# Patient Record
Sex: Female | Born: 1950 | Race: White | Hispanic: No | Marital: Single | Smoking: Current every day smoker
Health system: Southern US, Community
[De-identification: ages and names within clinical notes are randomized; demographics above are authoritative.]

## PROBLEM LIST (undated history)

## (undated) DIAGNOSIS — G3183 Dementia with Lewy bodies: Secondary | ICD-10-CM

## (undated) DIAGNOSIS — G20A1 Parkinson's disease without dyskinesia, without mention of fluctuations: Secondary | ICD-10-CM

## (undated) DIAGNOSIS — F0280 Dementia in other diseases classified elsewhere without behavioral disturbance: Secondary | ICD-10-CM

## (undated) DIAGNOSIS — G20 Parkinson's disease: Secondary | ICD-10-CM

## (undated) HISTORY — PX: CARPAL TUNNEL RELEASE: SHX101

## (undated) HISTORY — PX: NO PAST SURGERIES: SHX2092

---

## 2009-09-08 ENCOUNTER — Emergency Department: Payer: Self-pay | Admitting: Emergency Medicine

## 2010-01-14 ENCOUNTER — Ambulatory Visit: Payer: Self-pay | Admitting: Internal Medicine

## 2010-01-16 ENCOUNTER — Ambulatory Visit: Payer: Self-pay | Admitting: Internal Medicine

## 2011-08-09 ENCOUNTER — Ambulatory Visit: Payer: Self-pay | Admitting: Family Medicine

## 2014-10-02 ENCOUNTER — Other Ambulatory Visit: Payer: Self-pay | Admitting: Family Medicine

## 2014-10-02 ENCOUNTER — Ambulatory Visit
Admission: RE | Admit: 2014-10-02 | Discharge: 2014-10-02 | Disposition: A | Discharge status: Home / Self Care | Payer: Medicare Other | Source: Ambulatory Visit | Attending: Family Medicine | Admitting: Family Medicine

## 2014-10-02 DIAGNOSIS — G319 Degenerative disease of nervous system, unspecified: Secondary | ICD-10-CM | POA: Insufficient documentation

## 2014-10-02 DIAGNOSIS — S0990XA Unspecified injury of head, initial encounter: Secondary | ICD-10-CM | POA: Diagnosis present

## 2014-10-02 DIAGNOSIS — R51 Headache: Secondary | ICD-10-CM | POA: Diagnosis present

## 2017-02-25 ENCOUNTER — Encounter: Payer: Self-pay | Admitting: Emergency Medicine

## 2017-02-25 ENCOUNTER — Emergency Department: Payer: Medicare Other

## 2017-02-25 ENCOUNTER — Emergency Department
Admission: EM | Admit: 2017-02-25 | Discharge: 2017-02-25 | Disposition: A | Discharge status: Home / Self Care | Payer: Medicare Other | Attending: Emergency Medicine | Admitting: Emergency Medicine

## 2017-02-25 DIAGNOSIS — N3001 Acute cystitis with hematuria: Secondary | ICD-10-CM

## 2017-02-25 DIAGNOSIS — Z79899 Other long term (current) drug therapy: Secondary | ICD-10-CM | POA: Diagnosis not present

## 2017-02-25 DIAGNOSIS — R410 Disorientation, unspecified: Secondary | ICD-10-CM

## 2017-02-25 DIAGNOSIS — F17210 Nicotine dependence, cigarettes, uncomplicated: Secondary | ICD-10-CM | POA: Diagnosis not present

## 2017-02-25 DIAGNOSIS — Z7982 Long term (current) use of aspirin: Secondary | ICD-10-CM | POA: Insufficient documentation

## 2017-02-25 DIAGNOSIS — G20 Parkinson's disease: Secondary | ICD-10-CM | POA: Insufficient documentation

## 2017-02-25 HISTORY — DX: Parkinson's disease without dyskinesia, without mention of fluctuations: G20.A1

## 2017-02-25 HISTORY — DX: Dementia in other diseases classified elsewhere, unspecified severity, without behavioral disturbance, psychotic disturbance, mood disturbance, and anxiety: F02.80

## 2017-02-25 HISTORY — DX: Dementia with Lewy bodies: G31.83

## 2017-02-25 HISTORY — DX: Parkinson's disease: G20

## 2017-02-25 LAB — COMPREHENSIVE METABOLIC PANEL
ALBUMIN: 4.6 g/dL (ref 3.5–5.0)
ALT: 53 U/L (ref 14–54)
AST: 44 U/L — AB (ref 15–41)
Alkaline Phosphatase: 73 U/L (ref 38–126)
Anion gap: 13 (ref 5–15)
BILIRUBIN TOTAL: 0.4 mg/dL (ref 0.3–1.2)
BUN: 17 mg/dL (ref 6–20)
CO2: 23 mmol/L (ref 22–32)
CREATININE: 0.68 mg/dL (ref 0.44–1.00)
Calcium: 9.6 mg/dL (ref 8.9–10.3)
Chloride: 101 mmol/L (ref 101–111)
GFR calc Af Amer: >60 mL/min (ref >60)
GFR calc non Af Amer: >60 mL/min (ref >60)
GLUCOSE: 93 mg/dL (ref 65–99)
POTASSIUM: 3.3 mmol/L — AB (ref 3.5–5.1)
Sodium: 137 mmol/L (ref 135–145)
TOTAL PROTEIN: 8.2 g/dL — AB (ref 6.5–8.1)

## 2017-02-25 LAB — CBC
HEMATOCRIT: 42.5 % (ref 35.0–47.0)
Hemoglobin: 14.4 g/dL (ref 12.0–16.0)
MCH: 31.9 pg (ref 26.0–34.0)
MCHC: 33.9 g/dL (ref 32.0–36.0)
MCV: 94.1 fL (ref 80.0–100.0)
Platelets: 220 10*3/uL (ref 150–440)
RBC: 4.51 MIL/uL (ref 3.80–5.20)
RDW: 14.5 % (ref 11.5–14.5)
WBC: 8.2 10*3/uL (ref 3.6–11.0)

## 2017-02-25 LAB — URINALYSIS, COMPLETE (UACMP) WITH MICROSCOPIC
Bilirubin Urine: NEGATIVE
Glucose, UA: NEGATIVE mg/dL
Ketones, ur: 20 mg/dL — AB
Nitrite: NEGATIVE
Protein, ur: 100 mg/dL — AB
SPECIFIC GRAVITY, URINE: 1.023 (ref 1.005–1.030)
pH: 5 (ref 5.0–8.0)

## 2017-02-25 LAB — TROPONIN I: Troponin I: <0.03 ng/mL (ref <0.03)

## 2017-02-25 MED ORDER — CEPHALEXIN 500 MG PO CAPS: 500.0000 mg | ORAL_CAPSULE | Freq: Three times a day (TID) | ORAL | 0 refills | Status: AC

## 2017-02-25 MED ORDER — CEFTRIAXONE SODIUM IN DEXTROSE 20 MG/ML IV SOLN
1.0000 g | Freq: Once | INTRAVENOUS | Status: AC
  Administered 2017-02-25: 1 g via INTRAVENOUS
  Filled 2017-02-25: qty 50

## 2017-02-25 NOTE — ED Provider Notes (Signed)
[facility] Emergency Department Provider Note ____________________________________________   I have reviewed the triage vital signs and the triage nursing note.  HISTORY  Chief Complaint Altered Mental Status   Historian Patient and friend who is her power of attorney due to patient's underlying liver body dementia/Parkinson's  HPI Maria Wu is a 66 y.o. female with Lewy body dementia and Parkinson's, lives with her friend who is also her power of attorney over the past 2 years.  This patient had been doing okay the past couple of days and developed upper respiratory congestion over the past 2-3 days.  Last night the patient seemed to be a little confused.  She took a shower in the middle of the night which is unusual and then this morning was found in her roommates room, with close off.  Mild cough without significant wheezing.  Last COPD exacerbation with prednisone was over 3 weeks ago.  No no fevers.  No reported vomiting or diarrhea.  No headache or chest pain or abdominal pain.  Friend states this has happened before once when there is urinary tract infection.   Past Medical History:  Diagnosis Date  . Lewy body dementia   . Parkinson's disease (HCC)     There are no active problems to display for this patient.   History reviewed. No pertinent surgical history.  Prior to Admission medications   Medication Sig Start Date End Date Taking? Authorizing Provider  aspirin EC 81 MG tablet Take 81 mg by mouth daily.   Yes [provider]  cilostazol (PLETAL) 50 MG tablet Take 50 mg by mouth 2 (two) times daily.   Yes [provider]  citalopram (CELEXA) 40 MG tablet Take 40 mg by mouth daily.   Yes [provider]  docusate sodium (COLACE) 100 MG capsule Take 100 mg by mouth daily.   Yes [provider]  donepezil (ARICEPT) 10 MG tablet Take 10 mg by mouth daily.   Yes [provider]  ENALAPRIL MALEATE  PO Take 1 tablet by mouth daily.   Yes [provider]  ezetimibe (ZETIA) 10 MG tablet Take 10 mg by mouth daily.   Yes [provider]  Melatonin 3 MG TABS Take 1 tablet by mouth at bedtime.   Yes [provider]  metoprolol tartrate (LOPRESSOR) 25 MG tablet Take 25 mg by mouth daily.   Yes [provider]  Multiple Vitamins-Minerals (IMMUNE SUPPORT PO) Take 1 tablet by mouth.   Yes [provider]  Probiotic Product (PROBIOTIC FORMULA) CAPS Take 1 capsule by mouth daily.   Yes [provider]  valACYclovir (VALTREX) 500 MG tablet Take 500-1,000 mg by mouth daily. 1000mg  for breakouts   Yes [provider]    Allergies  Allergen Reactions  . Coumadin [Warfarin Sodium] Other (See Comments)    unknown  . Statins     cramps    No family history on file.  Social History Social History  Substance Use Topics  . Smoking status: Current Every Day Smoker    Packs/day: 1.00    Types: Cigarettes  . Smokeless tobacco: Not on file  . Alcohol use Not on file    Review of Systems  Constitutional: Negative for fever. Eyes: Negative for visual changes. ENT: Negative for sore throat.  Mild nasal congestion. Cardiovascular: Negative for chest pain. Respiratory: Negative for shortness of breath. Gastrointestinal: Negative for abdominal pain, vomiting and diarrhea. Genitourinary: Negative for dysuria. Musculoskeletal: Negative for back pain.  Skin: Negative for rash. Neurological: Negative for headache.  ____________________________________________   PHYSICAL EXAM:  VITAL SIGNS: ED Triage Vitals  Enc Vitals Group     BP 02/25/17 1318 119/74     Pulse Rate 02/25/17 1318 95     Resp 02/25/17 1318 20     Temp 02/25/17 1318 99.2 F (37.3 C)     Temp Source 02/25/17 1318 Oral     SpO2 02/25/17 1318 99 %     Weight 02/25/17 1319 136 lb (61.7 kg)     Height 02/25/17 1319 5\' 4"  (1.626 m)     Head Circumference --      Peak  Flow --      Pain Score --      Pain Loc --      Pain Edu? --      Excl. in GC? --      Constitutional: Alert and operative. Well appearing and in no distress. HEENT   Head: Normocephalic and atraumatic.      Eyes: Conjunctivae are normal. Pupils equal and round.       Ears:         Nose: No congestion without any obvious rhinorrhea.   Mouth/Throat: Mucous membranes are moist.   Neck: No stridor. Cardiovascular/Chest: Normal rate, regular rhythm.  No murmurs, rubs, or gallops. Respiratory: Normal respiratory effort without tachypnea nor retractions. Breath sounds are clear and equal bilaterally. No wheezes/rales/rhonchi. Gastrointestinal: Soft. No distention, no guarding, no rebound. Nontender.    Genitourinary/rectal:Deferred Musculoskeletal: Nontender with normal range of motion in all extremities. No joint effusions.  No lower extremity tenderness.  No edema. Neurologic: No facial droop.  Poor historian.  Normal speech and language. No gross or focal neurologic deficits are appreciated. Skin:  Skin is warm, dry and intact. No rash noted. Psychiatric: Mood and affect are normal. Speech and behavior are normal. Patient exhibits appropriate insight and judgment.   ____________________________________________  LABS (pertinent positives/negatives) I, Governor Rooks, MD the attending physician have reviewed the labs noted below.  Labs Reviewed  COMPREHENSIVE METABOLIC PANEL - Abnormal; Notable for the following:       Result Value   Potassium 3.3 (*)    Total Protein 8.2 (*)    AST 44 (*)    All other components within normal limits  CBC  TROPONIN I  BLOOD GAS, VENOUS  URINALYSIS, COMPLETE (UACMP) WITH MICROSCOPIC    ____________________________________________    EKG I, Governor Rooks, MD, the attending physician have personally viewed and interpreted all ECGs.  91 bpm.  Normal sinus rhythm.  Narrow QRS.  Normal axis.  Normal ST and T  wave ____________________________________________  RADIOLOGY All Xrays were viewed by me.  Imaging interpreted by Radiologist, and I, Governor Rooks, MD the attending physician have reviewed the radiologist interpretation noted below.  X-ray two-view: Pending __________________________________________  PROCEDURES  Procedure(s) performed: None  Critical Care performed: None  ____________________________________________  No current facility-administered medications on file prior to encounter.    No current outpatient prescriptions on file prior to encounter.    ____________________________________________  ED COURSE / ASSESSMENT AND PLAN  Pertinent labs & imaging results that were available during my care of the patient were reviewed by me and considered in my medical decision making (see chart for details).    Caregiver noticed a change in patient having some confusion over the last 24 hours.  She does have mild upper respiratory congestion without significant lower respiratory symptoms, but will obtain an x-ray.  Patient  has no focal neurologic deficits nor complaints, this does not seem like a focal intracranial issue like CVA or bleed or tumor etc.  Laboratory studies are overall reassuring without any GI disturbance or dehydration.  I am going to add on blood gas to evaluate for any CO2 retention given that she does have a history of COPD, although she does not appear to be having an acute COPD exacerbation now.  Urinalysis is pending.  Patient is agreeable for and out catheter sample.  Patient care transferred to Dr. Roxan Hockey at shift change 3:20 PM.  Patient is awaiting blood gas, chest x-ray, and urinalysis.  Friend and POA here is agreeable that if these are reassuring, this may be due to underlying dementia and they are comfortable taking her home.  DIFFERENTIAL DIAGNOSIS: Includes but not limited to disorders of glucose, electrolyte disturbance, dehydration,  infection, urinary tract infection, CO2 retention, liver disease, dementia, stroke, etc.  CONSULTATIONS: None   Patient / Family / Caregiver informed of clinical course, medical decision-making process, and agree with plan.   ___________________________________________   FINAL CLINICAL IMPRESSION(S) / ED DIAGNOSES   Final diagnoses:  Confusion              Note: This dictation was prepared with Dragon dictation. Any transcriptional errors that result from this process are unintentional    Governor Rooks, MD 02/25/17 1520

## 2017-02-25 NOTE — ED Provider Notes (Signed)
Patient received in sign-out from Dr. Shaune PollackLord.  Workup and evaluation pending UA.  Patient's urinalysis does show evidence of acute urinary tract infection with hematuria.  Given her age and risk factors with confusion we will give her a dose of IV Rocephin.  Patient without any other signs or symptoms of urosepsis.  Patient and family feel comfortable with discharge home and follow-up as an outpatient.  Will be discharged with oral Keflex.      Willy Eddyobinson, Jayvyn Haselton, MD 02/25/17 [phone]

## 2017-02-25 NOTE — ED Triage Notes (Signed)
Friend and caregiver states Maria Wu has been increased confusion x 2 days. History of Lewy body dementia with some baseline confusion. Friend with her is also healthcare power of attorney.

## 2017-02-26 LAB — URINE CULTURE: Culture: NO GROWTH

## 2017-03-03 LAB — BLOOD GAS, VENOUS
Acid-Base Excess: 2.4 mmol/L — ABNORMAL HIGH (ref 0.0–2.0)
Bicarbonate: 27.2 mmol/L (ref 20.0–28.0)
PATIENT TEMPERATURE: 37
PH VEN: 7.42 (ref 7.250–7.430)
pCO2, Ven: 42 mmHg — ABNORMAL LOW (ref 44.0–60.0)

## 2018-09-23 ENCOUNTER — Ambulatory Visit
Admission: EM | Admit: 2018-09-23 | Discharge: 2018-09-23 | Disposition: A | Discharge status: Home / Self Care | Payer: Medicare Other | Attending: Urgent Care | Admitting: Urgent Care

## 2018-09-23 ENCOUNTER — Other Ambulatory Visit: Payer: Self-pay

## 2018-09-23 DIAGNOSIS — N3001 Acute cystitis with hematuria: Secondary | ICD-10-CM | POA: Insufficient documentation

## 2018-09-23 LAB — URINALYSIS, COMPLETE (UACMP) WITH MICROSCOPIC
Bilirubin Urine: NEGATIVE
Glucose, UA: NEGATIVE mg/dL
Ketones, ur: NEGATIVE mg/dL
Nitrite: POSITIVE — AB
Protein, ur: 100 mg/dL — AB
Specific Gravity, Urine: 1.025 (ref 1.005–1.030)
WBC, UA: >50 WBC/hpf (ref 0–5)
pH: 5.5 (ref 5.0–8.0)

## 2018-09-23 MED ORDER — PHENAZOPYRIDINE HCL 100 MG PO TABS: 200.0000 mg | ORAL_TABLET | Freq: Three times a day (TID) | ORAL | 0 refills | Status: AC

## 2018-09-23 MED ORDER — NITROFURANTOIN MONOHYD MACRO 100 MG PO CAPS: 100.0000 mg | ORAL_CAPSULE | Freq: Two times a day (BID) | ORAL | 0 refills | Status: AC

## 2018-09-23 NOTE — ED Provider Notes (Signed)
[address], [address] [place], [place] [address] [phone]   Name: Maria Wu DOB: 06/03/1950 MRN: [id number] CSN: [id number] PCP: Virgina EvenerPowell-Tillman, Levonne Genese, MD  Arrival date and time:  09/23/18 1233  Chief Complaint:  Urinary Frequency  NOTE: Prior to seeing the patient today, I have reviewed the triage nursing documentation and vital signs. Clinical staff has updated patient's PMH/PSHx, current medication list, and drug allergies/intolerances to ensure comprehensive history available to assist in medical decision making.   History:   HPI: Maria Wu is a 68 y.o. female who presents today with complaints of urinary symptoms worsening x1 week.  Patient presents with a caregiver today, who secondary to patient's advanced Lewy body dementia, serves as the primary historian.  Caregiver notes that patient has been incontinent of urine for the last week.  Patient with urinary frequency and urgency.  Unsure if she is experiencing dysuria.  Patient is only voiding very small amounts each time.  Caregiver has not appreciated any gross hematuria.  Patient reported to remove her clothes and urinate in the floor and on the couch, which is not normal behavior for the patient.  Caregiver indicates the patient has complained of pain in her lower back.  No measured fevers at home. Patient has a history of some issues with fecal incontinence, therefore they are using incontinence garments already.  Patient has been confused beyond her demented baseline.  Past Medical History:  Diagnosis Date  . Lewy body dementia (HCC)   . Parkinson's disease (HCC)     History reviewed. No pertinent surgical history.  History reviewed. No pertinent family history.  Social History   Socioeconomic History  . Marital status: Single    Spouse name: Not on file  . Number of children: Not on file  . Years of education: Not on file  . Highest education level: Not on file  Occupational History  .  Not on file  Social Needs  . Financial resource strain: Not on file  . Food insecurity:    Worry: Not on file    Inability: Not on file  . Transportation needs:    Medical: Not on file    Non-medical: Not on file  Tobacco Use  . Smoking status: Current Every Day Smoker    Packs/day: 0.50    Types: Cigarettes  . Smokeless tobacco: Never Used  Substance and Sexual Activity  . Alcohol use: Not Currently  . Drug use: Not Currently  . Sexual activity: Not on file  Lifestyle  . Physical activity:    Days per week: Not on file    Minutes per session: Not on file  . Stress: Not on file  Relationships  . Social connections:    Talks on phone: Not on file    Gets together: Not on file    Attends religious service: Not on file    Active member of club or organization: Not on file    Attends meetings of clubs or organizations: Not on file    Relationship status: Not on file  . Intimate partner violence:    Fear of current or ex partner: Not on file    Emotionally abused: Not on file    Physically abused: Not on file    Forced sexual activity: Not on file  Other Topics Concern  . Not on file  Social History Narrative  . Not on file    There are no active problems to display for this patient.   Home Medications:  Current Meds  Medication Sig  . aspirin EC 81 MG tablet Take 81 mg by mouth daily.  . cilostazol (PLETAL) 50 MG tablet Take 50 mg by mouth 2 (two) times daily.  . citalopram (CELEXA) 40 MG tablet Take 40 mg by mouth daily.  Marland Kitchen donepezil (ARICEPT) 10 MG tablet Take 10 mg by mouth daily.  . ENALAPRIL MALEATE PO Take 1 tablet by mouth daily.  Marland Kitchen ezetimibe (ZETIA) 10 MG tablet Take 10 mg by mouth daily.  . Melatonin 3 MG TABS Take 1 tablet by mouth at bedtime.  . metoprolol tartrate (LOPRESSOR) 25 MG tablet Take 25 mg by mouth daily.  . Multiple Vitamins-Minerals (IMMUNE SUPPORT PO) Take 1 tablet by mouth.  . nitroGLYCERIN (NITROSTAT) 0.4 MG SL tablet Place under the  tongue.  . Probiotic Product (PROBIOTIC FORMULA) CAPS Take 1 capsule by mouth daily.  . valACYclovir (VALTREX) 500 MG tablet Take 500-1,000 mg by mouth daily. 1000mg  for breakouts    Allergies:   Sulfa antibiotics; Clopidogrel; Coumadin [warfarin sodium]; and Statins  Review of Systems (ROS): Review of Systems  Constitutional: Negative for chills and fever.  Respiratory: Negative for cough and shortness of breath.   Cardiovascular: Negative for chest pain and palpitations.  Gastrointestinal: Negative for abdominal pain, diarrhea, nausea and vomiting.  Genitourinary: Positive for decreased urine volume, frequency and urgency. Negative for hematuria.       New urinary incontinence  Musculoskeletal: Positive for back pain.  Neurological: Negative for dizziness, syncope and weakness.  Psychiatric/Behavioral: Positive for confusion (increased; baseline dementia).     Physical Exam:  Triage Vital Signs ED Triage Vitals  Enc Vitals Group     BP 09/23/18 1250 107/75     Pulse Rate 09/23/18 1250 (!) 58     Resp 09/23/18 1250 16     Temp 09/23/18 1250 98.5 F (36.9 C)     Temp Source 09/23/18 1250 Oral     SpO2 09/23/18 1250 100 %     Weight 09/23/18 1247 130 lb (59 kg)     Height --      Head Circumference --      Peak Flow --      Pain Score --      Pain Loc --      Pain Edu? --      Excl. in GC? --     Physical Exam  Constitutional: She is well-developed, well-nourished, and in no distress.  HENT:  Head: Normocephalic and atraumatic.  Mouth/Throat: Oropharynx is clear and moist and mucous membranes are normal.  Eyes: Pupils are equal, round, and reactive to light. EOM are normal.  Neck: Normal range of motion. Neck supple. No tracheal deviation present.  Cardiovascular: Regular rhythm, normal heart sounds and intact distal pulses. Bradycardia present. Exam reveals no gallop and no friction rub.  No murmur heard. Pulmonary/Chest: Effort normal and breath sounds normal. No  respiratory distress. She has no wheezes. She has no rales.  Abdominal: Bowel sounds are normal. There is abdominal tenderness in the suprapubic area. There is CVA tenderness.  Lymphadenopathy:    She has no cervical adenopathy.  Neurological: She is alert. She has normal motor skills. She is disoriented (PMH (+) for Lewy body dementia).  Skin: Skin is warm and dry. No rash noted. No erythema.  Psychiatric: Mood, affect and judgment normal.  Nursing note and vitals reviewed.    Urgent Care Treatments / Results:   LABS: PLEASE NOTE: all labs that were ordered this encounter  are listed, however only abnormal results are displayed. Labs Reviewed  URINALYSIS, COMPLETE (UACMP) WITH MICROSCOPIC - Abnormal; Notable for the following components:      Result Value   APPearance CLOUDY (*)    Hgb urine dipstick MODERATE (*)    Protein, ur 100 (*)    Nitrite POSITIVE (*)    Leukocytes,Ua LARGE (*)    Bacteria, UA MANY (*)    All other components within normal limits  URINE CULTURE    EKG: -None  RADIOLOGY: No results found.  PRODEDURES: Procedures  MEDICATIONS RECEIVED THIS VISIT: Medications - No data to display  PERTINENT CLINICAL COURSE NOTES/UPDATES: No data to display   Initial Impression / Assessment and Plan / Urgent Care Course:    JOEANN STEPPE is a 68 y.o. female who presents to [facility] today with complaints of Urinary Frequency  Pertinent labs & imaging results that were available during my care of the patient were personally reviewed by me and considered in my medical decision making (see lab/imaging section of note for values and interpretations).  Patient presents with urinary symptoms x1 week.  Caregiver serving as primary historians patient has advanced Lewy body dementia.  Exam reveals suprapubic tenderness.  UA positive for infection; reflex culture sent.  Patient with allergies to sulfa antibiotics.  Will treat with a seven-day course of  nitrofurantoin.  Discussed that if culture demonstrates resistance to the prescribed antibiotic, we will call caregiver to arrange for change in antibiotic therapy.  We will also provide a short course of Pyridium for as needed use a caregiver for perceived urinary pain.  Patient may use ibuprofen and/or Tylenol as needed as well.  Encouraged to increase fluid intake to flush urinary tract.  Discussed avoiding caffeinated beverages to prevent painful urinary spasms.  Copy of UA provided to caregiver per request.  Discussed follow up with primary care physician in 1 week for re-evaluation. I have reviewed the follow up and strict return precautions for any new or worsening symptoms. Patient is aware of symptoms that would be deemed urgent/emergent, and would thus require further evaluation either here or in the emergency department. At the time of discharge, she verbalized understanding and consent with the discharge plan as it was reviewed with her. All questions were fielded by provider and/or clinic staff prior to patient discharge.    Final Clinical Impressions(s) / Urgent Care Diagnoses:   Final diagnoses:  Acute cystitis with hematuria    New Prescriptions:   Meds ordered this encounter  Medications  . nitrofurantoin, macrocrystal-monohydrate, (MACROBID) 100 MG capsule    Sig: Take 1 capsule (100 mg total) by mouth 2 (two) times daily for 7 days.    Dispense:  14 capsule    Refill:  0  . phenazopyridine (PYRIDIUM) 100 MG tablet    Sig: Take 2 tablets (200 mg total) by mouth 3 (three) times daily.    Dispense:  12 tablet    Refill:  0    Controlled Substance Prescriptions:  [place] Controlled Substance Registry consulted? Not Applicable  NOTE: This note was prepared using Dragon dictation software along with smaller phrase technology. Despite my best ability to proofread, there is the potential that transcriptional errors may still occur from this process, and are completely unintentional.      Verlee Monte, NP 09/23/18 1515

## 2018-09-23 NOTE — ED Triage Notes (Signed)
Patient presents to [facility] with caregiver and caregiver states that she has been urinating on the floor and has had a smell to urine over the last week. States that patient has dementia and she thinks that this may be a UTI. Patient caregiver states that she has been having back pain and has been wearing pullups.

## 2018-09-23 NOTE — Discharge Instructions (Addendum)
It was very nice meeting you today in clinic. Thank you for entrusting me with your care.   As discussed, your urine is POSITIVE for infection. Will approach treatment as follows:  Prescription has been sent to your pharmacy for antibiotics.  Please pick up and take as directed. FINISH the entire course of medication even if you are feeling better.  A culture will be sent on your provided sample. If it comes back resistant to what I have prescribed you, someone will call you and let you know that we will need to change antibiotics. Increase fluid intake as much as possible to flush your urinary tract.  Water is always the best.  Avoid caffeine until your infection clears up, as it can contribute to painful bladder spasms.  May use Tylenol and/or Ibuprofen as needed for pain/fever. Pyridium as needed to help with pain/discomfort.   Make arrangements to follow up with your regular doctor in 1 week for re-evaluation. If your symptoms/condition worsens, please seek follow up care either here or in the ER. Please remember, our [facility] providers are "right here with you" when you need [place].   Again, it was my pleasure to take care of you today. Thank you for choosing our clinic. I hope that you start to feel better quickly.   Quentin Mulling, MSN, APRN, FNP-C, CEN Advanced Practice Provider [facility] [place]

## 2018-09-25 LAB — URINE CULTURE: Culture: >=100000 — AB

## 2018-09-27 ENCOUNTER — Telehealth (HOSPITAL_COMMUNITY): Payer: Self-pay | Admitting: Emergency Medicine

## 2018-09-27 NOTE — Telephone Encounter (Signed)
Urine culture was positive for e coli and was given  macrobid at urgent care visit. Attempted to reach patient. No answer at this time.   

## 2018-10-25 ENCOUNTER — Ambulatory Visit
Admission: EM | Admit: 2018-10-25 | Discharge: 2018-10-25 | Disposition: A | Discharge status: Home / Self Care | Payer: Medicare Other | Attending: Emergency Medicine | Admitting: Emergency Medicine

## 2018-10-25 ENCOUNTER — Encounter: Payer: Self-pay | Admitting: Emergency Medicine

## 2018-10-25 ENCOUNTER — Other Ambulatory Visit: Payer: Self-pay

## 2018-10-25 DIAGNOSIS — R319 Hematuria, unspecified: Secondary | ICD-10-CM | POA: Insufficient documentation

## 2018-10-25 DIAGNOSIS — N390 Urinary tract infection, site not specified: Secondary | ICD-10-CM | POA: Diagnosis not present

## 2018-10-25 LAB — URINALYSIS, COMPLETE (UACMP) WITH MICROSCOPIC
Bilirubin Urine: NEGATIVE
Glucose, UA: NEGATIVE mg/dL
Ketones, ur: NEGATIVE mg/dL
Nitrite: POSITIVE — AB
Protein, ur: NEGATIVE mg/dL
Specific Gravity, Urine: 1.025 (ref 1.005–1.030)
WBC, UA: >50 WBC/hpf (ref 0–5)
pH: 5.5 (ref 5.0–8.0)

## 2018-10-25 MED ORDER — CEPHALEXIN 500 MG PO CAPS
500.0000 mg | ORAL_CAPSULE | Freq: Two times a day (BID) | ORAL | 0 refills | Status: AC
Start: 2018-10-25 — End: 2018-11-01

## 2018-10-25 NOTE — ED Provider Notes (Signed)
[facility] ____________________________________________  Time seen: Approximately 4:33 PM  I have reviewed the triage vital signs and the nursing notes.   HISTORY  Chief Complaint Dysuria  Historian: Care giver  HPI Maria Wu is a 68 y.o. female with history of Parkinson's disease and Lewy body dementia presenting with caregiver for evaluation of urinary frequency, urinary burning sensation.  Caregiver reports she has had some urinary incontinence in her pull-up.  Reports similar issues in May with a urinary tract infection.  States did improve after last Macrobid treatment.  Continues to eat and drink at baseline, but does not drink fluids as well as she should.  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  Caregiver reports no other complaints of pain.  Denies fevers, cough, congestion or recent sickness otherwise.  Denies aggravating alleviating factors.   Past Medical History:  Diagnosis Date  . Lewy body dementia (HCC)   . Parkinson's disease (HCC)     There are no active problems to display for this patient.   History reviewed. No pertinent surgical history.   No current facility-administered medications for this encounter.   Current Outpatient Medications:  .  aspirin EC 81 MG tablet, Take 81 mg by mouth daily., Disp: , Rfl:  .  cilostazol (PLETAL) 50 MG tablet, Take 50 mg by mouth 2 (two) times daily., Disp: , Rfl:  .  citalopram (CELEXA) 40 MG tablet, Take 40 mg by mouth daily., Disp: , Rfl:  .  donepezil (ARICEPT) 10 MG tablet, Take 10 mg by mouth daily., Disp: , Rfl:  .  ENALAPRIL MALEATE PO, Take 1 tablet by mouth daily., Disp: , Rfl:  .  ezetimibe (ZETIA) 10 MG tablet, Take 10 mg by mouth daily., Disp: , Rfl:  .  Melatonin 3 MG TABS, Take 1 tablet by mouth at bedtime., Disp: , Rfl:  .  metoprolol tartrate (LOPRESSOR) 25 MG tablet, Take 25 mg by mouth daily., Disp: , Rfl:  .  Multiple Vitamins-Minerals (IMMUNE SUPPORT PO), Take 1 tablet by  mouth., Disp: , Rfl:  .  nitroGLYCERIN (NITROSTAT) 0.4 MG SL tablet, Place under the tongue., Disp: , Rfl:  .  phenazopyridine (PYRIDIUM) 100 MG tablet, Take 2 tablets (200 mg total) by mouth 3 (three) times daily., Disp: 12 tablet, Rfl: 0 .  Probiotic Product (PROBIOTIC FORMULA) CAPS, Take 1 capsule by mouth daily., Disp: , Rfl:  .  cephALEXin (KEFLEX) 500 MG capsule, Take 1 capsule (500 mg total) by mouth 2 (two) times daily for 7 days., Disp: 14 capsule, Rfl: 0 .  valACYclovir (VALTREX) 500 MG tablet, Take 500-1,000 mg by mouth daily. 1000mg  for breakouts, Disp: , Rfl:   Allergies Sulfa antibiotics, Clopidogrel, Coumadin [warfarin sodium], and Statins  History reviewed. No pertinent family history.  Social History Social History   Tobacco Use  . Smoking status: Current Every Day Smoker    Packs/day: 0.50    Types: Cigarettes  . Smokeless tobacco: Never Used  Substance Use Topics  . Alcohol use: Not Currently  . Drug use: Not Currently    Review of Systems Constitutional: No fever Cardiovascular: Denies chest pain. Respiratory: Denies shortness of breath. Gastrointestinal: No abdominal pain.  No nausea, no vomiting.  No diarrhea.   Genitourinary: Positive for dysuria. Musculoskeletal: Positive chronic back pain. Skin: Negative for rash.   ____________________________________________   PHYSICAL EXAM:  VITAL SIGNS: ED Triage Vitals  Enc Vitals Group     BP 10/25/18 1452 92/68     Pulse  Rate 10/25/18 1452 61     Resp 10/25/18 1452 14     Temp 10/25/18 1452 98.1 F (36.7 C)     Temp Source 10/25/18 1452 Oral     SpO2 10/25/18 1452 100 %     Weight 10/25/18 1458 127 lb 11.2 oz (57.9 kg)     Height --      Head Circumference --      Peak Flow --      Pain Score 10/25/18 1449 2     Pain Loc --      Pain Edu? --      Excl. in [place]? --     Constitutional: Alert. Well appearing and in no acute distress. ENT      Head: Normocephalic and atraumatic. Cardiovascular:  Normal rate, regular rhythm. Grossly normal heart sounds.  Good peripheral circulation. Respiratory: Normal respiratory effort without tachypnea nor retractions. Breath sounds are clear and equal bilaterally. No wheezes, rales, rhonchi. Gastrointestinal: Mild midline suprapubic tenderness palpation.  Abdomen otherwise soft and nontender.  No CVA tenderness. Musculoskeletal: Steady gait. Neurologic: Steady gait.  Dementia. Skin:  Skin is warm, dry and intact. No rash noted. Psychiatric: Mood and affect are normal. Speech and behavior are normal. Patient exhibits appropriate insight and judgment   ___________________________________________   LABS (all labs ordered are listed, but only abnormal results are displayed)  Labs Reviewed  URINALYSIS, COMPLETE (UACMP) WITH MICROSCOPIC - Abnormal; Notable for the following components:      Result Value   APPearance CLOUDY (*)    Hgb urine dipstick SMALL (*)    Nitrite POSITIVE (*)    Leukocytes,Ua MODERATE (*)    Bacteria, UA MANY (*)    All other components within normal limits  URINE CULTURE   ____________________________________________   PROCEDURES Procedures    INITIAL IMPRESSION / ASSESSMENT AND PLAN / ED COURSE  Pertinent labs & imaging results that were available during my care of the patient were reviewed by me and considered in my medical decision making (see chart for details).  Well-appearing patient.  No acute distress.  Caregiver at bedside.  Dysuria complaints.  Urinalysis reviewed, consistent with UTI.  We will culture.  Will treat with oral Keflex.  Encourage supportive care, close monitoring.Discussed indication, risks and benefits of medications with patient and caregiver.  Discussed follow up with Primary care physician this week. Discussed follow up and return parameters including no resolution or any worsening concerns. Caregiver verbalized understanding and agreed to plan.    ____________________________________________   FINAL CLINICAL IMPRESSION(S) / ED DIAGNOSES  Final diagnoses:  Urinary tract infection with hematuria, site unspecified     ED Discharge Orders         Ordered    cephALEXin (KEFLEX) 500 MG capsule  2 times daily     10/25/18 1556           Note: This dictation was prepared with Dragon dictation along with smaller phrase technology. Any transcriptional errors that result from this process are unintentional.         Marylene Land, NP 10/25/18 1640

## 2018-10-25 NOTE — ED Triage Notes (Signed)
Patient c/o burning when urinating and increase in urinary frequency for a week.  Caregiver denies fevers.

## 2018-10-25 NOTE — Discharge Instructions (Addendum)
Take medication as prescribed. Drink plenty of fluids. Monitor.  ° °Follow up with your primary care physician this week as needed. Return to Urgent care for new or worsening concerns.  ° °

## 2018-10-27 ENCOUNTER — Telehealth (HOSPITAL_COMMUNITY): Payer: Self-pay | Admitting: Emergency Medicine

## 2018-10-27 LAB — URINE CULTURE: Culture: >=100000 — AB

## 2018-10-27 NOTE — Telephone Encounter (Signed)
Urine culture was positive for e coli and was given keflex  at urgent care visit. Attempted to reach patient. No answer at this time.   

## 2018-11-24 ENCOUNTER — Ambulatory Visit
Admission: EM | Admit: 2018-11-24 | Discharge: 2018-11-24 | Disposition: A | Discharge status: Home / Self Care | Payer: Medicare Other | Attending: Family Medicine | Admitting: Family Medicine

## 2018-11-24 ENCOUNTER — Other Ambulatory Visit: Payer: Self-pay

## 2018-11-24 ENCOUNTER — Encounter: Payer: Self-pay | Admitting: Emergency Medicine

## 2018-11-24 DIAGNOSIS — N390 Urinary tract infection, site not specified: Secondary | ICD-10-CM | POA: Diagnosis present

## 2018-11-24 DIAGNOSIS — R319 Hematuria, unspecified: Secondary | ICD-10-CM | POA: Diagnosis present

## 2018-11-24 LAB — URINALYSIS, COMPLETE (UACMP) WITH MICROSCOPIC
Bilirubin Urine: NEGATIVE
Glucose, UA: NEGATIVE mg/dL
Ketones, ur: NEGATIVE mg/dL
Nitrite: POSITIVE — AB
Protein, ur: NEGATIVE mg/dL
Specific Gravity, Urine: 1.01 (ref 1.005–1.030)
WBC, UA: >50 WBC/hpf (ref 0–5)
pH: 5.5 (ref 5.0–8.0)

## 2018-11-24 MED ORDER — CIPROFLOXACIN HCL 500 MG PO TABS: 500.0000 mg | ORAL_TABLET | Freq: Two times a day (BID) | ORAL | 0 refills | Status: AC

## 2018-11-24 NOTE — Discharge Instructions (Addendum)
Take medication as prescribed. Monitor. Drink plenty of fluids.  ° °Follow up with your primary care physician this week as needed. Return to Urgent care for new or worsening concerns.  ° °

## 2018-11-24 NOTE — ED Provider Notes (Signed)
[facility] ____________________________________________  Time seen: Approximately 1:22 PM  I have reviewed the triage vital signs and the nursing notes.   HISTORY  Chief Complaint No chief complaint on file.  Historian: Caregiver  HPI Maria Wu is a 68 y.o. female past medical history of Parkinson's and Lewy body dementia presenting with caregiver at bedside for evaluation of urinary tract infection symptoms.  Caregiver reports that patient for the last 3 days has had some intermittent discomfort with urination as well as some cloudy urine.  Did give Pyridium which helped complaints.  States symptoms consistent with previous and recent UTIs.  Has had 2 recent UTIs treated with Macrobid and cephalexin.  Continues to eat and drink well.  States trigger is that patient does not always wipe correctly.  No fevers.  No recent cough or otherwise sickness.   Past Medical History:  Diagnosis Date  . Lewy body dementia ([place])   . Parkinson's disease ([place])     There are no active problems to display for this patient.   Past Surgical History:  Procedure Laterality Date  . CARPAL TUNNEL RELEASE    . NO PAST SURGERIES       No current facility-administered medications for this encounter.   Current Outpatient Medications:  .  aspirin EC 81 MG tablet, Take 81 mg by mouth daily., Disp: , Rfl:  .  cilostazol (PLETAL) 50 MG tablet, Take 50 mg by mouth 2 (two) times daily., Disp: , Rfl:  .  citalopram (CELEXA) 40 MG tablet, Take 40 mg by mouth daily., Disp: , Rfl:  .  donepezil (ARICEPT) 10 MG tablet, Take 10 mg by mouth daily., Disp: , Rfl:  .  ENALAPRIL MALEATE PO, Take 1 tablet by mouth daily., Disp: , Rfl:  .  ezetimibe (ZETIA) 10 MG tablet, Take 10 mg by mouth daily., Disp: , Rfl:  .  Melatonin 3 MG TABS, Take 1 tablet by mouth at bedtime., Disp: , Rfl:  .  metoprolol tartrate (LOPRESSOR) 25 MG tablet, Take 25 mg by mouth daily., Disp: , Rfl:  .  Multiple  Vitamins-Minerals (IMMUNE SUPPORT PO), Take 1 tablet by mouth., Disp: , Rfl:  .  nitroGLYCERIN (NITROSTAT) 0.4 MG SL tablet, Place under the tongue., Disp: , Rfl:  .  phenazopyridine (PYRIDIUM) 100 MG tablet, Take 2 tablets (200 mg total) by mouth 3 (three) times daily., Disp: 12 tablet, Rfl: 0 .  Probiotic Product (PROBIOTIC FORMULA) CAPS, Take 1 capsule by mouth daily., Disp: , Rfl:  .  valACYclovir (VALTREX) 500 MG tablet, Take 500-1,000 mg by mouth daily. 1000mg  for breakouts, Disp: , Rfl:  .  ciprofloxacin (CIPRO) 500 MG tablet, Take 1 tablet (500 mg total) by mouth every 12 (twelve) hours for 7 days., Disp: 14 tablet, Rfl: 0  Allergies Sulfa antibiotics, Clopidogrel, Coumadin [warfarin sodium], and Statins  History reviewed. No pertinent family history.  Social History Social History   Tobacco Use  . Smoking status: Current Every Day Smoker    Packs/day: 0.50    Types: Cigarettes  . Smokeless tobacco: Never Used  Substance Use Topics  . Alcohol use: Not Currently  . Drug use: Not Currently    Review of Systems Constitutional: No fever Cardiovascular: Denies chest pain. Respiratory: Denies shortness of breath. Gastrointestinal: No abdominal pain. Genitourinary: Positive for dysuria. Musculoskeletal: Negative for back pain. Skin: Negative for rash.  ____________________________________________   PHYSICAL EXAM:  VITAL SIGNS: ED Triage Vitals  Enc Vitals Group     BP  11/24/18 1243 140/87     Pulse Rate 11/24/18 1243 (!) 55     Resp 11/24/18 1243 18     Temp 11/24/18 1243 97.6 F (36.4 C)     Temp Source 11/24/18 1243 Oral     SpO2 11/24/18 1243 100 %     Weight 11/24/18 1244 130 lb (59 kg)     Height 11/24/18 1244 5\' 4"  (1.626 m)     Head Circumference --      Peak Flow --      Pain Score --      Pain Loc --      Pain Edu? --      Excl. in [place]? --     Constitutional: Alert. Well appearing and in no acute distress. Eyes: Conjunctivae are normal.  ENT       Head: Normocephalic and atraumatic. Cardiovascular: Normal rate, regular rhythm. Grossly normal heart sounds.  Good peripheral circulation. Respiratory: Normal respiratory effort without tachypnea nor retractions. Breath sounds are clear and equal bilaterally. No wheezes, rales, rhonchi. Gastrointestinal: Soft and nontender. No CVA tenderness. Musculoskeletal: Steady gait. Neurologic: Alert and interactive. Skin:  Skin is warm, dry and intact.    ___________________________________________   LABS (all labs ordered are listed, but only abnormal results are displayed)  Labs Reviewed  URINALYSIS, COMPLETE (UACMP) WITH MICROSCOPIC - Abnormal; Notable for the following components:      Result Value   Color, Urine AMBER (*)    APPearance CLOUDY (*)    Hgb urine dipstick TRACE (*)    Nitrite POSITIVE (*)    Leukocytes,Ua MODERATE (*)    Bacteria, UA MANY (*)    All other components within normal limits  URINE CULTURE   ____________________________________________  PROCEDURES Procedures    INITIAL IMPRESSION / ASSESSMENT AND PLAN / ED COURSE  Pertinent labs & imaging results that were available during my care of the patient were reviewed by me and considered in my medical decision making (see chart for details).  Well-appearing patient.  Caregiver at bedside.  Urinalysis positive for UTI.  We will culture.  Patient recent urine culture showed susceptibility all antibiotics.  Recently treated with Macrobid and Keflex and patient sulfa allergic.  Denies renal insufficiency.  Will treat with oral Cipro.  Follow-up with primary care.Discussed indication, risks and benefits of medications with caregiver.  Discussed follow up and return parameters including no resolution or any worsening concerns. Caregiver verbalized understanding and agreed to plan.   ____________________________________________   FINAL CLINICAL IMPRESSION(S) / ED DIAGNOSES  Final diagnoses:  Urinary tract infection  with hematuria, site unspecified     ED Discharge Orders         Ordered    ciprofloxacin (CIPRO) 500 MG tablet  Every 12 hours     11/24/18 1322           Note: This dictation was prepared with Dragon dictation along with smaller phrase technology. Any transcriptional errors that result from this process are unintentional.         Renford DillsMiller, Elijahjames Fuelling, NP 11/24/18 1355

## 2018-11-24 NOTE — ED Triage Notes (Signed)
Pt c/o urinary frequency, and  urinary retention. Denies fever. Started about 3 days ago. Pt has dementia

## 2018-11-26 ENCOUNTER — Telehealth (HOSPITAL_COMMUNITY): Payer: Self-pay | Admitting: Emergency Medicine

## 2018-11-26 NOTE — Telephone Encounter (Signed)
Urine culture was positive for e coli and was given  cipro at urgent care visit. Attempted to reach patient. No answer at this time.   

## 2018-11-27 LAB — URINE CULTURE: Culture: >=100000 — AB

## 2019-01-26 ENCOUNTER — Ambulatory Visit
Admission: EM | Admit: 2019-01-26 | Discharge: 2019-01-26 | Disposition: A | Discharge status: Home / Self Care | Payer: Medicare Other | Attending: Urgent Care | Admitting: Urgent Care

## 2019-01-26 ENCOUNTER — Other Ambulatory Visit: Payer: Self-pay

## 2019-01-26 DIAGNOSIS — R4182 Altered mental status, unspecified: Secondary | ICD-10-CM | POA: Insufficient documentation

## 2019-01-26 DIAGNOSIS — R32 Unspecified urinary incontinence: Secondary | ICD-10-CM

## 2019-01-26 LAB — URINALYSIS, COMPLETE (UACMP) WITH MICROSCOPIC
Bacteria, UA: NONE SEEN
Bilirubin Urine: NEGATIVE
Glucose, UA: NEGATIVE mg/dL
Hgb urine dipstick: NEGATIVE
Ketones, ur: NEGATIVE mg/dL
Leukocytes,Ua: NEGATIVE
Nitrite: NEGATIVE
Protein, ur: NEGATIVE mg/dL
Specific Gravity, Urine: 1.02 (ref 1.005–1.030)
pH: 6 (ref 5.0–8.0)

## 2019-01-26 NOTE — ED Triage Notes (Signed)
Pt caregiver states pt has been having more incontinence than usual and has altered mental status. She urinated in a trash can. Started about a week ago.

## 2019-01-26 NOTE — Discharge Instructions (Addendum)
Recommend continue to monitor symptoms. Will notify you regarding your urine culture results- no infection noted with your urinalysis today. May need to see your PCP or Neurologist regarding continuing symptoms. Follow-up as planned.

## 2019-01-26 NOTE — ED Provider Notes (Signed)
[facility]    CSN: [id number] Arrival date & time: 01/26/19  1443      History   Chief Complaint Chief Complaint  Patient presents with  . Urinary Incontinence  . Altered Mental Status    HPI Maria Wu is a 68 y.o. female.   68 year old female accompanied by her caregiver with concern over increase in urinary incontinence over the past week. Also having an increase in mental status changes and confusion. She recently urinated in a trash can which is very unusual for her. Caregiver concerned that she has another UTI. She has had a UTI once a month for the past 3 to 4 months. She has been taking Pyridium with minimal relief. Also with the Pyridium kit, there is a test kit which indicates presence of leukocytes and the test last night was positive. She denies any distinct urinary, abdominal or unusual back pain or unusual vaginal discharge. Caregiver uncertain if more behavioral changes and hallucinations are due to an infection or her dementia is getting worse. Currently on Lopressor, Enalapril, Zetia, aspirin, Pletal, Celexa, Aricept and Probiotic daily and Valtrex as needed.   The history is provided by a caregiver.  Altered Mental Status Presenting symptoms: behavior changes, confusion and disorientation   Severity:  Moderate Most recent episode:  Today Episode history:  Multiple Progression:  Worsening Context: dementia   Context: not alcohol use, not drug use, not head injury, not recent change in medication and not recent illness   Associated symptoms: bladder incontinence and hallucinations   Associated symptoms: no abdominal pain, no fever, no headaches, no light-headedness, no nausea, no rash, no seizures, no suicidal behavior, no vomiting and no weakness     Past Medical History:  Diagnosis Date  . Lewy body dementia ([place])   . Parkinson's disease ([place])     There are no active problems to display for this patient.   Past Surgical History:   Procedure Laterality Date  . CARPAL TUNNEL RELEASE    . NO PAST SURGERIES      OB History   No obstetric history on file.      Home Medications    Prior to Admission medications   Medication Sig Start Date End Date Taking? Authorizing Provider  aspirin EC 81 MG tablet Take 81 mg by mouth daily.   Yes [provider]  cilostazol (PLETAL) 50 MG tablet Take 50 mg by mouth 2 (two) times daily.   Yes [provider]  citalopram (CELEXA) 40 MG tablet Take 40 mg by mouth daily.   Yes [provider]  donepezil (ARICEPT) 10 MG tablet Take 10 mg by mouth daily.   Yes [provider]  ENALAPRIL MALEATE PO Take 1 tablet by mouth daily.   Yes [provider]  ezetimibe (ZETIA) 10 MG tablet Take 10 mg by mouth daily.   Yes [provider]  Melatonin 3 MG TABS Take 1 tablet by mouth at bedtime.   Yes [provider]  metoprolol tartrate (LOPRESSOR) 25 MG tablet Take 25 mg by mouth daily.   Yes [provider]  Multiple Vitamins-Minerals (IMMUNE SUPPORT PO) Take 1 tablet by mouth.   Yes [provider]  nitroGLYCERIN (NITROSTAT) 0.4 MG SL tablet Place under the tongue. 05/03/07  Yes [provider]  phenazopyridine (PYRIDIUM) 100 MG tablet Take 2 tablets (200 mg total) by mouth 3 (three) times daily. 09/23/18  Yes Karen Kitchens, NP  Probiotic Product (PROBIOTIC FORMULA) CAPS  Take 1 capsule by mouth daily.   Yes [provider]  valACYclovir (VALTREX) 500 MG tablet Take 500-1,000 mg by mouth daily. '1000mg'$  for breakouts   Yes [provider]    Family History No family history on file.  Social History Social History   Tobacco Use  . Smoking status: Current Every Day Smoker    Packs/day: 0.50    Types: Cigarettes  . Smokeless tobacco: Never Used  Substance Use Topics  . Alcohol use: Not Currently  . Drug use: Not Currently     Allergies   Sulfa antibiotics, Clopidogrel, Coumadin  [warfarin sodium], and Statins   Review of Systems Review of Systems  Constitutional: Negative for appetite change, chills, fatigue and fever.  Eyes: Negative for photophobia, discharge and redness.  Gastrointestinal: Negative for abdominal pain, nausea and vomiting.  Genitourinary: Positive for bladder incontinence, enuresis, frequency and urgency. Negative for decreased urine volume, difficulty urinating, dysuria, flank pain, hematuria and vaginal discharge.  Musculoskeletal: Positive for arthralgias (shoulder pain) and back pain (chronic). Negative for myalgias.  Skin: Negative for color change, rash and wound.  Neurological: Negative for tremors, seizures, syncope, facial asymmetry, weakness, light-headedness, numbness and headaches.  Hematological: Negative for adenopathy. Bruises/bleeds easily.  Psychiatric/Behavioral: Positive for behavioral problems, confusion and hallucinations.     Physical Exam Triage Vital Signs ED Triage Vitals  Enc Vitals Group     BP 01/26/19 1506 99/63     Pulse Rate 01/26/19 1506 61     Resp 01/26/19 1506 18     Temp 01/26/19 1506 98.2 F (36.8 C)     Temp Source 01/26/19 1506 Oral     SpO2 01/26/19 1506 99 %     Weight --      Height --      Head Circumference --      Peak Flow --      Pain Score 01/26/19 1503 0     Pain Loc --      Pain Edu? --      Excl. in [place]? --    No data found.  Updated Vital Signs BP 99/63 (BP Location: Left Arm)   Pulse 61   Temp 98.2 F (36.8 C) (Oral)   Resp 18   SpO2 99%   Visual Acuity Right Eye Distance:   Left Eye Distance:   Bilateral Distance:    Right Eye Near:   Left Eye Near:    Bilateral Near:     Physical Exam Vitals signs and nursing note reviewed.  Constitutional:      General: She is awake. She is not in acute distress.    Appearance: She is well-developed and well-groomed. She is not ill-appearing.     Comments: Patient sitting comfortably on exam table in no acute distress.    HENT:     Head: Normocephalic and atraumatic.     Right Ear: External ear normal. There is impacted cerumen.     Left Ear: External ear normal. There is impacted cerumen.     Ears:     Comments: Caregiver indicates that patient will not allow anyone to remove cerumen.     Nose: Nose normal.     Mouth/Throat:     Lips: Pink.     Mouth: Mucous membranes are moist.     Dentition: Has dentures.     Pharynx: Oropharynx is clear.  Eyes:     Extraocular Movements: Extraocular movements intact.     Conjunctiva/sclera: Conjunctivae normal.  Neck:  Musculoskeletal: Normal range of motion.  Cardiovascular:     Rate and Rhythm: Normal rate and regular rhythm.  Pulmonary:     Effort: Pulmonary effort is normal. No respiratory distress.     Breath sounds: Normal breath sounds and air entry. No decreased breath sounds, wheezing or rhonchi.  Abdominal:     General: Bowel sounds are normal. There is no distension.     Palpations: Abdomen is soft. There is no mass.     Tenderness: There is no abdominal tenderness. There is no right CVA tenderness, left CVA tenderness, guarding or rebound.  Skin:    General: Skin is warm and dry.     Findings: No rash.  Neurological:     Mental Status: She is alert. She is disoriented.  Psychiatric:        Mood and Affect: Mood normal.        Speech: Speech is delayed.        Behavior: Behavior is cooperative.        Cognition and Memory: Cognition is impaired. Memory is impaired.      UC Treatments / Results  Labs (all labs ordered are listed, but only abnormal results are displayed) Labs Reviewed  URINE CULTURE  URINALYSIS, COMPLETE (UACMP) WITH MICROSCOPIC    EKG   Radiology No results found.  Procedures Procedures (including critical care time)  Medications Ordered in UC Medications - No data to display  Initial Impression / Assessment and Plan / UC Course  I have reviewed the triage vital signs and the nursing notes.  Pertinent  labs & imaging results that were available during my care of the patient were reviewed by me and considered in my medical decision making (see chart for details).    Reviewed urinalysis results with Caregiver. No distinct signs of UTI- urinalysis was negative. Will send urine for culture. Discussed that her behavioral changes may be due to worsening of dementia. Recommend continue to monitor and continue Pyridium as directed. Follow-up pending urine culture results and with her PCP/Neurologist next week for further evaluation.  Final Clinical Impressions(s) / UC Diagnoses   Final diagnoses:  Urinary incontinence, unspecified type  Altered mental status, unspecified altered mental status type     Discharge Instructions     Recommend continue to monitor symptoms. Will notify you regarding your urine culture results- no infection noted with your urinalysis today. May need to see your PCP or Neurologist regarding continuing symptoms. Follow-up as planned.     ED Prescriptions    None     PDMP not reviewed this encounter.   Katy Apo, NP 01/26/19 2134

## 2019-01-28 ENCOUNTER — Telehealth (HOSPITAL_COMMUNITY): Payer: Self-pay | Admitting: Emergency Medicine

## 2019-01-28 LAB — URINE CULTURE: Culture: 90000 — AB

## 2019-01-28 MED ORDER — AMPICILLIN 250 MG PO CAPS: 250.0000 mg | ORAL_CAPSULE | Freq: Four times a day (QID) | ORAL | 0 refills | Status: AC

## 2019-01-28 NOTE — Telephone Encounter (Signed)
Per Dr. Meda Coffee, send Ampicillin 250 QID x7 days to preferred pharmacy. Number on file is not in service. Called emergency contact and left VM to call back.

## 2019-01-30 NOTE — Telephone Encounter (Signed)
Pt caregiver informed. Voiced understanding.

## 2019-02-04 ENCOUNTER — Telehealth: Payer: Self-pay | Admitting: Urgent Care

## 2019-02-04 DIAGNOSIS — N390 Urinary tract infection, site not specified: Secondary | ICD-10-CM

## 2019-02-04 MED ORDER — NITROFURANTOIN MONOHYD MACRO 100 MG PO CAPS: 100.0000 mg | ORAL_CAPSULE | Freq: Two times a day (BID) | ORAL | 0 refills | Status: AC

## 2019-09-22 ENCOUNTER — Encounter: Payer: Medicare Other | Admitting: Internal Medicine

## 2019-09-22 ENCOUNTER — Other Ambulatory Visit: Payer: Self-pay

## 2019-09-23 ENCOUNTER — Non-Acute Institutional Stay: Payer: Medicare Other | Admitting: Internal Medicine

## 2019-09-23 ENCOUNTER — Other Ambulatory Visit: Payer: Self-pay

## 2019-09-23 DIAGNOSIS — Z515 Encounter for palliative care: Secondary | ICD-10-CM

## 2019-09-23 DIAGNOSIS — Z7189 Other specified counseling: Secondary | ICD-10-CM

## 2019-09-23 NOTE — Progress Notes (Signed)
May 28th, 2021 [facility] Palliative Care Consult Note Telephone: [phone]  Fax: [phone]  PATIENT NAME: Maria Wu DOB: 1950/06/01 MRN: [id number]  [facility]  PRIMARY CARE PROVIDER:   Angelica Pou, NP  REFERRING PROVIDER:  Angelica Pou, NP  RESPONSIBLE PARTY:   Coulson,Anne (Significant other)  [phone] [facility]). Rowe Clack (friend) [phone].  ASSESSMENT / RECOMMENDATIONS:  1. Advance Care Planning: A. Directives: DNR present in chart. I uploaded into [facility] EMR. B. Goals of Care: pnd discussion with her significant other.  2. Cognitive / Functional status: Dementia Fast Scale 7B. Patient is constantly confused and disoriented. Her speech is not on topic and mostly incomprehensible. Able to follow a few simple commands (request to sit down). Staff report no behavioral issues. She is incontinent of bowel and bladder.  Dependent for dressing and hygiene. Able to manage finger foods.   She constantly wanders about the hallways without assistive devices, and with a fairly steady gait. No falls.  The last recorded weight I have for her is 137lbs Dec 2020. At a height of 5'4" her BMI is 23.5kg/m2.   3. Family Supports: Significant other: Claudie Revering  4. Follow up Palliative Care Visit: in 2-4 months. I'll call and touch base with patient's significant other after the weekend.  I spent 60 minutes providing this consultation from 10am to 11am. More than 50% of the time in this consultation was spent coordinating communication.   HISTORY OF PRESENT ILLNESS:  Maria Wu is a 69 y.o. year old female with Lewy body dementia, parkinson's, CAD, PVD. IBS, depression . 01/26/2019: ER  MS changes d/t progression dementia   Palliative Care was asked to help address goals of care.   CODE STATUS: DNR  PPS: 50%  HOSPICE ELIGIBILITY/DIAGNOSIS: TBD  PAST MEDICAL HISTORY:  Past Medical History:  Diagnosis Date  . Lewy body  dementia (HCC)   . Parkinson's disease (HCC)     SOCIAL HX:  Social History   Tobacco Use  . Smoking status: Current Every Day Smoker    Packs/day: 0.50    Types: Cigarettes  . Smokeless tobacco: Never Used  Substance Use Topics  . Alcohol use: Not Currently    ALLERGIES:  Allergies  Allergen Reactions  . Sulfa Antibiotics Hives    Other reaction(s): RASH   . Clopidogrel     Other reaction(s): Other (See Comments) Other Reaction: BRUISING Other reaction(s): Other (See Comments) Other Reaction: BRUISING   . Coumadin [Warfarin Sodium] Other (See Comments)    unknown  . Statins Other (See Comments)    cramps Other reaction(s): Muscle Pain      PERTINENT MEDICATIONS:  Outpatient Encounter Medications as of 09/23/2019  Medication Sig  . acetaminophen (TYLENOL) 325 MG tablet Take 650 mg by mouth every 6 (six) hours as needed.  . Ascorbic Acid (VITAMIN C) 1000 MG tablet Take 1,000 mg by mouth daily.  Marland Kitchen aspirin EC 81 MG tablet Take 81 mg by mouth daily.  . busPIRone (BUSPAR) 10 MG tablet Take 10 mg by mouth daily.  . cetirizine (ZYRTEC) 10 MG tablet Take 10 mg by mouth daily.  . cilostazol (PLETAL) 50 MG tablet Take 50 mg by mouth 2 (two) times daily.  . citalopram (CELEXA) 40 MG tablet Take 40 mg by mouth daily.  . Cranberry 500 MG CAPS Take 1 capsule by mouth daily.  Marland Kitchen donepezil (ARICEPT) 10 MG tablet Take 10 mg by mouth daily.  . ENALAPRIL MALEATE PO Take  1 tablet by mouth daily. 2.5mg  qd  . ezetimibe (ZETIA) 10 MG tablet Take 10 mg by mouth daily.  Marland Kitchen loperamide (IMODIUM) 2 MG capsule Take 2 mg by mouth as needed for diarrhea or loose stools.  Marland Kitchen LORazepam (ATIVAN) 0.5 MG tablet Take 0.5 mg by mouth every 8 (eight) hours as needed for anxiety.  . Melatonin 3 MG TABS Take 1 tablet by mouth at bedtime.  . meloxicam (MOBIC) 7.5 MG tablet Take 7.5 mg by mouth daily.  . memantine (NAMENDA) 10 MG tablet Take 10 mg by mouth daily.  . metoprolol succinate (TOPROL-XL) 25 MG 24 hr  tablet Take 12.5 mg by mouth daily.  . metoprolol tartrate (LOPRESSOR) 25 MG tablet Take 25 mg by mouth daily.  . Multiple Vitamins-Minerals (IMMUNE SUPPORT PO) Take 1 tablet by mouth.  . nitroGLYCERIN (NITROSTAT) 0.4 MG SL tablet Place under the tongue.  . valACYclovir (VALTREX) 500 MG tablet Take 500-1,000 mg by mouth daily. 1000mg  for breakouts  . nitrofurantoin, macrocrystal-monohydrate, (MACROBID) 100 MG capsule Take 1 capsule (100 mg total) by mouth 2 (two) times daily.  . phenazopyridine (PYRIDIUM) 100 MG tablet Take 2 tablets (200 mg total) by mouth 3 (three) times daily.  . Probiotic Product (PROBIOTIC FORMULA) CAPS Take 1 capsule by mouth daily.   No facility-administered encounter medications on file as of 09/23/2019.    PHYSICAL EXAM:   General: slender, disheveled older female who I found wandering about the hallways barefoot. She did not know where her room was. Not maintaining consistent eye contact. Sweet and affect and disposition; smiling and pleasant. Allowed me to direct her to her room and put some socks on her. Cardiovascular: regular rate and rhythm Pulmonary: clear ant fields Abdomen: soft, nontender, + bowel sounds Extremities: no edema, no joint deformities Skin: no rashes Neurological: non-focal  Julianne Handler, NP

## 2019-09-24 ENCOUNTER — Encounter: Payer: Self-pay | Admitting: Internal Medicine

## 2019-12-09 ENCOUNTER — Other Ambulatory Visit: Payer: Self-pay

## 2019-12-09 ENCOUNTER — Emergency Department (HOSPITAL_COMMUNITY)
Admission: EM | Admit: 2019-12-09 | Discharge: 2019-12-10 | Disposition: A | Discharge status: Home / Self Care | Payer: Medicare Other | Attending: Emergency Medicine | Admitting: Emergency Medicine

## 2019-12-09 ENCOUNTER — Encounter (HOSPITAL_COMMUNITY): Payer: Self-pay | Admitting: Emergency Medicine

## 2019-12-09 DIAGNOSIS — F17210 Nicotine dependence, cigarettes, uncomplicated: Secondary | ICD-10-CM | POA: Diagnosis not present

## 2019-12-09 DIAGNOSIS — Z0000 Encounter for general adult medical examination without abnormal findings: Secondary | ICD-10-CM | POA: Diagnosis present

## 2019-12-09 DIAGNOSIS — F0390 Unspecified dementia without behavioral disturbance: Secondary | ICD-10-CM | POA: Diagnosis not present

## 2019-12-09 DIAGNOSIS — R410 Disorientation, unspecified: Secondary | ICD-10-CM | POA: Insufficient documentation

## 2019-12-09 DIAGNOSIS — Z711 Person with feared health complaint in whom no diagnosis is made: Secondary | ICD-10-CM
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
## 2019-12-09 NOTE — Discharge Instructions (Addendum)
Maria Wu had no complaints while in the ER. There is no signs of significant injury and her exam is unremarkable.

## 2020-01-23 ENCOUNTER — Non-Acute Institutional Stay: Payer: Medicare Other | Admitting: Nurse Practitioner

## 2020-01-23 ENCOUNTER — Other Ambulatory Visit: Payer: Self-pay

## 2020-01-23 DIAGNOSIS — Z515 Encounter for palliative care: Secondary | ICD-10-CM

## 2020-01-23 DIAGNOSIS — F0391 Unspecified dementia with behavioral disturbance: Secondary | ICD-10-CM
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
## 2020-03-28 ENCOUNTER — Emergency Department (HOSPITAL_COMMUNITY)
Admission: EM | Admit: 2020-03-28 | Discharge: 2020-03-29 | Disposition: A | Discharge status: Home / Self Care | Payer: Medicare Other | Attending: Emergency Medicine | Admitting: Emergency Medicine

## 2020-03-28 ENCOUNTER — Encounter (HOSPITAL_COMMUNITY): Payer: Self-pay

## 2020-03-28 ENCOUNTER — Other Ambulatory Visit: Payer: Self-pay

## 2020-03-28 DIAGNOSIS — Z79899 Other long term (current) drug therapy: Secondary | ICD-10-CM | POA: Diagnosis not present

## 2020-03-28 DIAGNOSIS — G20 Parkinson's disease: Secondary | ICD-10-CM | POA: Diagnosis not present

## 2020-03-28 DIAGNOSIS — S50312A Abrasion of left elbow, initial encounter: Secondary | ICD-10-CM | POA: Diagnosis not present

## 2020-03-28 DIAGNOSIS — S4992XA Unspecified injury of left shoulder and upper arm, initial encounter: Secondary | ICD-10-CM | POA: Diagnosis present

## 2020-03-28 DIAGNOSIS — F17210 Nicotine dependence, cigarettes, uncomplicated: Secondary | ICD-10-CM | POA: Diagnosis not present

## 2020-03-28 DIAGNOSIS — W500XXA Accidental hit or strike by another person, initial encounter: Secondary | ICD-10-CM | POA: Insufficient documentation

## 2020-03-28 DIAGNOSIS — W19XXXA Unspecified fall, initial encounter: Secondary | ICD-10-CM

## 2020-03-28 DIAGNOSIS — F0390 Unspecified dementia without behavioral disturbance: Secondary | ICD-10-CM | POA: Diagnosis not present
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
## 2020-03-28 NOTE — ED Notes (Signed)
[facility] called for transport.  

## 2020-03-28 NOTE — ED Notes (Signed)
Pt ambulated very easily with one person assist.

## 2020-03-28 NOTE — Discharge Instructions (Signed)
Small abrasion to the elbow but no other wounds.

## 2020-04-19 ENCOUNTER — Other Ambulatory Visit: Payer: Self-pay

## 2020-04-19 ENCOUNTER — Non-Acute Institutional Stay: Payer: Medicare Other | Admitting: Nurse Practitioner

## 2020-04-19 DIAGNOSIS — R634 Abnormal weight loss: Secondary | ICD-10-CM

## 2020-04-19 DIAGNOSIS — Z515 Encounter for palliative care: Secondary | ICD-10-CM

## 2020-04-19 NOTE — Progress Notes (Addendum)
[facility] Pall[facility] Consult Note Telephone: [phone]  Fax: [phone]  PATIENT NAME: Maria Wu [address] [place] [address] [phone] (home)  DOB: [birth year] MRN: [id number]  PRIMARY CARE PROVIDER:    Nelia Shi, MD,  [address] [facility] [place] [address] [phone]  REFERRING PROVIDER:   Monico Blitz, NP  RESPONSIBLE PARTY:   Extended Emergency Contact Information Primary Emergency Contact: Maria Wu Address: [place], [place] [address] Johnnette Litter of [place] Phone: [phone] Mobile Phone: [phone] Relation: Significant other  I met face to face with patient in facility.  ASSESSMENT AND RECOMMENDATIONS:   Advance Care Planning: Goal of care: Goal of care is comfort and for patient to be happy. Directives: Signed DNR form on chart in facility and [facility] EMR. Awaiting family signature on MOST form, detail of MOST form include; Comfort measures, determine use and limitation of abx, IV fluids for a defined trial period, no feeding tube.     Cognitive / Functional decline / Symptom Management:  Patient confused, incoherent, has word salad times. Patient dependent on staff for all of her ADls, able to feed herself finger foods. Patient continues to have behavior related to constant wandering within the facility, staff report difficulty redirecting her to sit. Ambulates without assistive device. Patient had a fall with ED visit on 03/28/2020, she was evaluated in the ED and discharged. Patient continues to have weight loss related to constant wandering. Staff and family report increasing patient's snacking, providing finger foods that she can eat while walking. Family report bringing in extra snacks that includes cheese to increase patient's calorie intake. No report of fever, chills, or SOB. Family had  question about what palliative care does. Provided education that palliative care is a specialized medical care for people living with serious illness, aimed at facilitating improved quality of life through symptoms relief, assisting with advance care planning and establishing goals of care, family expressed appreciation for education provided.   Follow up Palliative Care Visit: Palliative care will continue to follow for goals of care clarification and symptom management. Return in about 4-8 weeks or prn.  Family /Caregiver/Community Supports: Significant other Maria Wu lives out of town, visits frequently.   I spent 48 minutes providing this consultation, time includes time spent with patient and on phone family, chart review, provider coordination, and documentation. More than 50% of the time in this consultation was spent counseling and coordinating communication.   CHIEF COMPLAINT: weight loss  History obtained from review of EMR, discussion with facility staff and interview with family. Records reviewed and summarized bellow.  HISTORY OF PRESENT ILLNESS:  Maria Wu is a 69 y.o. year old female with multiple medical problems including Lewy body dementia, parkinson's, CAD, PVD, IBS, depression. Patient with ongoing weight loss related to progression in dementia. Palliative Care was asked to follow this patient by consultation request of  Maria Blitz, NP to help address advance care planning and goals of care. This is a follow up visit from 01/23/2020.  CODE STATUS: DNR  PPS: 50%  HOSPICE ELIGIBILITY/DIAGNOSIS: TBD  PHYSICAL EXAM / ROS:   Current and past weights: 118.1lbs down from 120lbs at last palliative care visit 3 months ago. General: frail appearing, thin, uncooperative, standing in living area in no sign of acute distress, refused to sit during visit Cardiovascular: no  chest pain reported, no edema  Pulmonary: no acute cough, no increased SOB, room air Abdomen:  appetite good, no report of constipation, incontinent of bowel GU: no report of dysuria, incontinent of urine MSK:  no joint and ROM abnormalities, ambulatory Skin: no rashes or wounds reported or noted on exposed skin Psych: non -anxious affect, flat affect  PAST MEDICAL HISTORY:  Past Medical History:  Diagnosis Date   Lewy body dementia ([place])    Parkinson's disease ([place])     SOCIAL HX:  Social History   Tobacco Use   Smoking status: Current Every Day Smoker    Packs/day: 0.50    Types: Cigarettes   Smokeless tobacco: Never Used  Substance Use Topics   Alcohol use: Not Currently   FAMILY HX: No family history on file.  ALLERGIES:  Allergies  Allergen Reactions   Sulfa Antibiotics Hives    Other reaction(s): RASH    Clopidogrel     Other reaction(s): Other (See Comments) Other Reaction: BRUISING Other reaction(s): Other (See Comments) Other Reaction: BRUISING    Coumadin [Warfarin Sodium] Other (See Comments)    unknown   Statins Other (See Comments)    cramps Other reaction(s): Muscle Pain      PERTINENT MEDICATIONS:  Outpatient Encounter Medications as of 04/19/2020  Medication Sig   acetaminophen (TYLENOL) 325 MG tablet Take 650 mg by mouth every 6 (six) hours as needed.   Ascorbic Acid (VITAMIN C) 1000 MG tablet Take 1,000 mg by mouth daily.   aspirin EC 81 MG tablet Take 81 mg by mouth daily.   busPIRone (BUSPAR) 10 MG tablet Take 10 mg by mouth daily.   cetirizine (ZYRTEC) 10 MG tablet Take 10 mg by mouth daily.   cilostazol (PLETAL) 50 MG tablet Take 50 mg by mouth 2 (two) times daily.   citalopram (CELEXA) 40 MG tablet Take 40 mg by mouth daily.   Cranberry 500 MG CAPS Take 1 capsule by mouth daily.   donepezil (ARICEPT) 10 MG tablet Take 10 mg by mouth daily.   ENALAPRIL MALEATE PO Take 1 tablet by mouth daily. 2.$RemoveBefore'5mg'[id number]$  qd   ezetimibe (ZETIA) 10 MG tablet Take 10 mg by mouth daily.   loperamide (IMODIUM) 2 MG capsule Take 2 mg by  mouth as needed for diarrhea or loose stools.   LORazepam (ATIVAN) 0.5 MG tablet Take 0.5 mg by mouth every 8 (eight) hours as needed for anxiety.   Melatonin 3 MG TABS Take 1 tablet by mouth at bedtime.   meloxicam (MOBIC) 7.5 MG tablet Take 7.5 mg by mouth daily.   memantine (NAMENDA) 10 MG tablet Take 10 mg by mouth daily.   metoprolol succinate (TOPROL-XL) 25 MG 24 hr tablet Take 12.5 mg by mouth daily.   metoprolol tartrate (LOPRESSOR) 25 MG tablet Take 25 mg by mouth daily.   Multiple Vitamins-Minerals (IMMUNE SUPPORT PO) Take 1 tablet by mouth.   nitrofurantoin, macrocrystal-monohydrate, (MACROBID) 100 MG capsule Take 1 capsule (100 mg total) by mouth 2 (two) times daily.   nitroGLYCERIN (NITROSTAT) 0.4 MG SL tablet Place under the tongue.   phenazopyridine (PYRIDIUM) 100 MG tablet Take 2 tablets (200 mg total) by mouth 3 (three) times daily.   Probiotic Product (PROBIOTIC FORMULA) CAPS Take 1 capsule by mouth daily.   valACYclovir (VALTREX) 500 MG tablet Take 500-1,000 mg by mouth daily. $RemoveBefo'1000mg'NThekwQKZRN$  for breakouts   No facility-administered encounter medications on file as of 04/19/2020.    Thank you for the opportunity to participate in the  care of Ms. Barbee Fath. The palliative care team will continue to follow. Please call our office at [phone] if we can be of additional assistance.   Jari Favre, DNP, AGPCNP-BC

## 2020-05-14 ENCOUNTER — Encounter (HOSPITAL_COMMUNITY): Payer: Self-pay | Admitting: Obstetrics and Gynecology

## 2020-05-14 ENCOUNTER — Other Ambulatory Visit: Payer: Self-pay

## 2020-05-14 ENCOUNTER — Emergency Department (HOSPITAL_COMMUNITY): Payer: Medicare Other

## 2020-05-14 ENCOUNTER — Emergency Department (HOSPITAL_COMMUNITY)
Admission: EM | Admit: 2020-05-14 | Discharge: 2020-05-15 | Disposition: A | Discharge status: Home / Self Care | Payer: Medicare Other | Attending: Emergency Medicine | Admitting: Emergency Medicine

## 2020-05-14 DIAGNOSIS — F0390 Unspecified dementia without behavioral disturbance: Secondary | ICD-10-CM | POA: Insufficient documentation

## 2020-05-14 DIAGNOSIS — Z7982 Long term (current) use of aspirin: Secondary | ICD-10-CM | POA: Diagnosis not present

## 2020-05-14 DIAGNOSIS — F17210 Nicotine dependence, cigarettes, uncomplicated: Secondary | ICD-10-CM | POA: Insufficient documentation

## 2020-05-14 DIAGNOSIS — W19XXXA Unspecified fall, initial encounter: Secondary | ICD-10-CM | POA: Insufficient documentation

## 2020-05-14 DIAGNOSIS — G20 Parkinson's disease: Secondary | ICD-10-CM | POA: Diagnosis not present

## 2020-05-14 DIAGNOSIS — E876 Hypokalemia: Secondary | ICD-10-CM | POA: Diagnosis present

## 2020-05-14 DIAGNOSIS — Y9301 Activity, walking, marching and hiking: Secondary | ICD-10-CM | POA: Insufficient documentation

## 2020-05-14 DIAGNOSIS — Y92129 Unspecified place in nursing home as the place of occurrence of the external cause: Secondary | ICD-10-CM | POA: Diagnosis not present

## 2020-05-14 LAB — BASIC METABOLIC PANEL
Anion gap: 13 (ref 5–15)
BUN: 25 mg/dL — ABNORMAL HIGH (ref 8–23)
CO2: 25 mmol/L (ref 22–32)
Calcium: 9.7 mg/dL (ref 8.9–10.3)
Chloride: 103 mmol/L (ref 98–111)
Creatinine, Ser: 1.05 mg/dL — ABNORMAL HIGH (ref 0.44–1.00)
GFR, Estimated: 57 mL/min — ABNORMAL LOW (ref >60)
Glucose, Bld: 128 mg/dL — ABNORMAL HIGH (ref 70–99)
Potassium: 2.8 mmol/L — ABNORMAL LOW (ref 3.5–5.1)
Sodium: 141 mmol/L (ref 135–145)

## 2020-05-14 LAB — CBC
HCT: 39.9 % (ref 36.0–46.0)
Hemoglobin: 12.8 g/dL (ref 12.0–15.0)
MCH: 28.9 pg (ref 26.0–34.0)
MCHC: 32.1 g/dL (ref 30.0–36.0)
MCV: 90.1 fL (ref 80.0–100.0)
Platelets: 243 10*3/uL (ref 150–400)
RBC: 4.43 MIL/uL (ref 3.87–5.11)
RDW: 13.7 % (ref 11.5–15.5)
WBC: 8.3 10*3/uL (ref 4.0–10.5)
nRBC: 0 % (ref 0.0–0.2)

## 2020-05-14 MED ORDER — POTASSIUM CHLORIDE ER 10 MEQ PO TBCR: 10.0000 meq | EXTENDED_RELEASE_TABLET | Freq: Two times a day (BID) | ORAL | 0 refills | Status: AC

## 2020-05-14 MED ORDER — POTASSIUM CHLORIDE 10 MEQ/100ML IV SOLN
10.0000 meq | Freq: Once | INTRAVENOUS | Status: AC
  Administered 2020-05-14: 10 meq via INTRAVENOUS
  Filled 2020-05-14: qty 100

## 2020-05-14 MED ORDER — POTASSIUM CHLORIDE CRYS ER 20 MEQ PO TBCR
40.0000 meq | EXTENDED_RELEASE_TABLET | Freq: Once | ORAL | Status: AC
  Administered 2020-05-14: 40 meq via ORAL
  Filled 2020-05-14: qty 2
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
## 2020-05-14 NOTE — Discharge Instructions (Addendum)
The xrays did not show any injuries. The potassium level was low today.  Take the potassium as prescribed.  Follow up with your doctor to have it rechecked.

## 2020-05-14 NOTE — ED Notes (Addendum)
[facility] called for transport.  

## 2020-05-14 NOTE — ED Provider Notes (Signed)
[facility]-EMERGENCY DEPT Provider Note   CSN: [id number] Arrival date & time: 05/14/20  1827     History Chief Complaint  Patient presents with  . Fall    Maria Wu is a 70 y.o. female.  HPI   Patient presents to the ED for evaluation of a fall at the nursing facility.  Patient has a history of dementia.  According to the EMS report the patient was at the dining facility when she had a ground-level fall.  Patient reportedly is at her baseline.  There was no loss of consciousness.  Unclear if she hit her head but EMS was called and the patient was placed in a C-spine collar.  Patient is unable to provide any history to me.  She will tell me her name but otherwise is mumbling in her speech and hard to understand.  Past Medical History:  Diagnosis Date  . Lewy body dementia (HCC)   . Parkinson's disease (HCC)     There are no problems to display for this patient.   Past Surgical History:  Procedure Laterality Date  . CARPAL TUNNEL RELEASE    . NO PAST SURGERIES       OB History   No obstetric history on file.     No family history on file.  Social History   Tobacco Use  . Smoking status: Current Every Day Smoker    Packs/day: 0.50    Types: Cigarettes  . Smokeless tobacco: Never Used  Vaping Use  . Vaping Use: Never used  Substance Use Topics  . Alcohol use: Not Currently  . Drug use: Not Currently    Home Medications Prior to Admission medications   Medication Sig Start Date End Date Taking? Authorizing Provider  acetaminophen (TYLENOL) 325 MG tablet Take 650 mg by mouth every 6 (six) hours as needed for mild pain.   Yes [provider]  aspirin EC 81 MG tablet Take 81 mg by mouth daily.   Yes [provider]  busPIRone (BUSPAR) 10 MG tablet Take 10 mg by mouth daily.   Yes [provider]  cetirizine (ZYRTEC) 10 MG tablet Take 10 mg by mouth at bedtime.   Yes [provider]  cilostazol  (PLETAL) 100 MG tablet Take 100 mg by mouth 2 (two) times daily.   Yes [provider]  citalopram (CELEXA) 40 MG tablet Take 40 mg by mouth daily.   Yes [provider]  Cranberry 500 MG CAPS Take 1 capsule by mouth daily.   Yes [provider]  donepezil (ARICEPT) 10 MG tablet Take 10 mg by mouth daily.   Yes [provider]  enalapril (VASOTEC) 2.5 MG tablet Take 2.5 mg by mouth daily.   Yes [provider]  ezetimibe (ZETIA) 10 MG tablet Take 10 mg by mouth daily.   Yes [provider]  loperamide (IMODIUM) 2 MG capsule Take 4 mg by mouth daily as needed for diarrhea or loose stools (Max 4 capsules in 24 hours).   Yes [provider]  LORazepam (ATIVAN) 0.5 MG tablet Take 0.25 mg by mouth every 8 (eight) hours as needed for anxiety.   Yes [provider]  Melatonin 3 MG TABS Take 1 tablet by mouth at bedtime.   Yes [provider]  meloxicam (MOBIC) 7.5 MG tablet Take 7.5 mg by mouth daily.   Yes [provider]  memantine (NAMENDA) 10 MG tablet Take 10 mg by mouth daily.  Yes [provider]  metoprolol tartrate (LOPRESSOR) 25 MG tablet Take 12.5 mg by mouth 2 (two) times daily.   Yes [provider]  nitroGLYCERIN (NITROSTAT) 0.4 MG SL tablet Place 0.4 mg under the tongue every 5 (five) minutes as needed for chest pain. 05/03/07  Yes [provider]  potassium chloride (KLOR-CON) 10 MEQ tablet Take 1 tablet (10 mEq total) by mouth 2 (two) times daily. 05/14/20  Yes Linwood DibblesKnapp, Abriel Hattery, MD  Probiotic Product (PROBIOTIC FORMULA) CAPS Take 1 capsule by mouth daily.   Yes [provider]  Sunscreens (COPPERTONE SPORT SPF 30 EX) Apply 1 application topically See admin instructions. Apply to exposed skin every 2 hours as needed while in sun   Yes [provider]  valACYclovir (VALTREX) 500 MG tablet Take 500 mg by mouth daily.   Yes [provider]  nitrofurantoin,  macrocrystal-monohydrate, (MACROBID) 100 MG capsule Take 1 capsule (100 mg total) by mouth 2 (two) times daily. Patient not taking: Reported on 05/14/2020 02/04/19   Verlee MonteGray, Bryan E, NP  phenazopyridine (PYRIDIUM) 100 MG tablet Take 2 tablets (200 mg total) by mouth 3 (three) times daily. Patient not taking: Reported on 05/14/2020 09/23/18   Verlee MonteGray, Bryan E, NP    Allergies    Sulfa antibiotics, Clopidogrel, Coumadin [warfarin sodium], Statins, and Varenicline  Review of Systems   Review of Systems  All other systems reviewed and are negative.   Physical Exam Updated Vital Signs BP 110/71   Pulse (!) 111   Temp 98.1 F (36.7 C) (Oral)   Resp 18   SpO2 100%   Physical Exam Vitals and nursing note reviewed.  Constitutional:      Appearance: She is well-developed and well-nourished. She is not ill-appearing or diaphoretic.  HENT:     Head: Normocephalic and atraumatic.     Right Ear: External ear normal.     Left Ear: External ear normal.  Eyes:     General: No scleral icterus.       Right eye: No discharge.        Left eye: No discharge.     Conjunctiva/sclera: Conjunctivae normal.  Neck:     Trachea: No tracheal deviation.     Comments: C-collar in place Cardiovascular:     Rate and Rhythm: Normal rate and regular rhythm.     Pulses: Intact distal pulses.  Pulmonary:     Effort: Pulmonary effort is normal. No respiratory distress.     Breath sounds: Normal breath sounds. No stridor. No wheezing or rales.  Abdominal:     General: Bowel sounds are normal. There is no distension.     Palpations: Abdomen is soft.     Tenderness: There is no abdominal tenderness. There is no guarding or rebound.  Musculoskeletal:        General: No tenderness or edema.     Cervical back: Neck supple.     Comments: No thoracic or lumbar spine tenderness, bilateral lower and upper extremities placed through full range of motion without any apparent discomfort, no areas of swelling or apparent  focal tenderness  Skin:    General: Skin is warm and dry.     Findings: No rash.  Neurological:     Mental Status: She is alert.     Cranial Nerves: No cranial nerve deficit (no facial droop, extraocular movements intact, no slurred speech).     Sensory: No sensory deficit.     Motor: No abnormal muscle tone or seizure activity.  Coordination: Coordination normal.     Deep Tendon Reflexes: Strength normal.  Psychiatric:        Mood and Affect: Mood and affect normal.     ED Results / Procedures / Treatments   Labs (all labs ordered are listed, but only abnormal results are displayed) Labs Reviewed  BASIC METABOLIC PANEL - Abnormal; Notable for the following components:      Result Value   Potassium 2.8 (*)    Glucose, Bld 128 (*)    BUN 25 (*)    Creatinine, Ser 1.05 (*)    GFR, Estimated 57 (*)    All other components within normal limits  CBC    EKG EKG Interpretation  Date/Time:  Monday May 14 2020 19:03:08 EST Ventricular Rate:  68 PR Interval:    QRS Duration: 110 QT Interval:  507 QTC Calculation: 540 R Axis:   64 Text Interpretation: Sinus rhythm Low voltage, extremity leads Prolonged QT interval No significant change since last tracing Confirmed by Linwood Dibbles [phone]) on 05/14/2020 7:35:51 PM   Radiology CT Head Wo Contrast  Result Date: 05/14/2020 CLINICAL DATA:  Head trauma ground level fall EXAM: CT HEAD WITHOUT CONTRAST CT CERVICAL SPINE WITHOUT CONTRAST TECHNIQUE: Multidetector CT imaging of the head and cervical spine was performed following the standard protocol without intravenous contrast. Multiplanar CT image reconstructions of the cervical spine were also generated. COMPARISON:  CT brain 10/12/2014 FINDINGS: CT HEAD FINDINGS Brain: No acute territorial infarction, hemorrhage or intracranial mass. Moderate atrophy. Moderate ventricular enlargement, increased compared to prior likely due to atrophy progression. Mild hypodensity in the white matter  likely due to chronic small vessel ischemic change. Vascular: No hyperdense vessels.  Carotid vascular calcification Skull: Normal. Negative for fracture or focal lesion. Sinuses/Orbits: No acute finding. Other: None CT CERVICAL SPINE FINDINGS Alignment: Straightening of the cervical spine. No significant listhesis. Facet alignment maintained. Skull base and vertebrae: No acute fracture. No primary bone lesion or focal pathologic process. Soft tissues and spinal canal: No prevertebral fluid or swelling. No visible canal hematoma. Disc levels: Multiple level degenerative change with moderate disc space narrowing C3-C4 and C4-C5 with advanced degenerative changes C5-C6, C6-C7 and C7-T1. Facet degenerative changes at multiple levels. Upper chest: Apical emphysema Other: None IMPRESSION: 1. No CT evidence for acute intracranial abnormality. Moderate atrophy. Moderately enlarged ventricles, likely due to atrophy progression. Mild hypodensity in the white matter, probable chronic small vessel ischemic change 2. Straightening of the cervical spine with degenerative changes. No acute osseous abnormality. Electronically Signed   By: Jasmine Pang M.D.   On: 05/14/2020 20:03   CT Cervical Spine Wo Contrast  Result Date: 05/14/2020 CLINICAL DATA:  Head trauma ground level fall EXAM: CT HEAD WITHOUT CONTRAST CT CERVICAL SPINE WITHOUT CONTRAST TECHNIQUE: Multidetector CT imaging of the head and cervical spine was performed following the standard protocol without intravenous contrast. Multiplanar CT image reconstructions of the cervical spine were also generated. COMPARISON:  CT brain 10/12/2014 FINDINGS: CT HEAD FINDINGS Brain: No acute territorial infarction, hemorrhage or intracranial mass. Moderate atrophy. Moderate ventricular enlargement, increased compared to prior likely due to atrophy progression. Mild hypodensity in the white matter likely due to chronic small vessel ischemic change. Vascular: No hyperdense vessels.   Carotid vascular calcification Skull: Normal. Negative for fracture or focal lesion. Sinuses/Orbits: No acute finding. Other: None CT CERVICAL SPINE FINDINGS Alignment: Straightening of the cervical spine. No significant listhesis. Facet alignment maintained. Skull base and vertebrae: No acute fracture. No primary bone lesion  or focal pathologic process. Soft tissues and spinal canal: No prevertebral fluid or swelling. No visible canal hematoma. Disc levels: Multiple level degenerative change with moderate disc space narrowing C3-C4 and C4-C5 with advanced degenerative changes C5-C6, C6-C7 and C7-T1. Facet degenerative changes at multiple levels. Upper chest: Apical emphysema Other: None IMPRESSION: 1. No CT evidence for acute intracranial abnormality. Moderate atrophy. Moderately enlarged ventricles, likely due to atrophy progression. Mild hypodensity in the white matter, probable chronic small vessel ischemic change 2. Straightening of the cervical spine with degenerative changes. No acute osseous abnormality. Electronically Signed   By: Jasmine Pang M.D.   On: 05/14/2020 20:03   DG Hips Bilat W or Wo Pelvis 2 Views  Result Date: 05/14/2020 CLINICAL DATA:  Recent fall with hip pain, initial encounter EXAM: DG HIP (WITH OR WITHOUT PELVIS) 2V BILAT COMPARISON:  None. FINDINGS: Pelvic ring is intact. No acute fracture or dislocation is noted. No soft tissue abnormality is seen. Bilateral iliac stenting is noted. IMPRESSION: No acute abnormality noted. Electronically Signed   By: Alcide Clever M.D.   On: 05/14/2020 19:37    Procedures Procedures (including critical care time)  Medications Ordered in ED Medications  potassium chloride 10 mEq in 100 mL IVPB (0 mEq Intravenous Stopped 05/14/20 2345)  potassium chloride SA (KLOR-CON) CR tablet 40 mEq (40 mEq Oral Given 05/14/20 2146)    ED Course  I have reviewed the triage vital signs and the nursing notes.  Pertinent labs & imaging results that were  available during my care of the patient were reviewed by me and considered in my medical decision making (see chart for details).  Clinical Course as of 05/16/20 1213  Mon May 14, 2020  2124 Patient's labs are notable for hypokalemia.  CBC otherwise unremarkable [JK]  2124 No signs of serious injury on head CT and C-spine CT.  Pelvic films are negative. [JK]  2124 IV and oral potassium ordered [JK]    Clinical Course User Index [JK] Linwood Dibbles, MD   MDM Rules/Calculators/A&P                          Pt presented for evaluation after a mechanical fall.  History limited due to dementia.  No physical signs of serious injury.  Screening labs notable for hypokalemia.  Pt treated with iv and oral potassium but otherwise no acute medical issues.  No signs of infection, dehydration.  Stable for discharge back to nursing facility. Final Clinical Impression(s) / ED Diagnoses Final diagnoses:  Fall, initial encounter  Hypokalemia    Rx / DC Orders ED Discharge Orders         Ordered    potassium chloride (KLOR-CON) 10 MEQ tablet  2 times daily        05/14/20 2316           Linwood Dibbles, MD 05/16/20 1215

## 2020-06-12 ENCOUNTER — Emergency Department (HOSPITAL_COMMUNITY): Payer: Medicare Other

## 2020-06-12 ENCOUNTER — Emergency Department (HOSPITAL_COMMUNITY)
Admission: EM | Admit: 2020-06-12 | Discharge: 2020-06-12 | Disposition: A | Discharge status: Home / Self Care | Payer: Medicare Other | Attending: Emergency Medicine | Admitting: Emergency Medicine

## 2020-06-12 DIAGNOSIS — W19XXXA Unspecified fall, initial encounter: Secondary | ICD-10-CM | POA: Insufficient documentation

## 2020-06-12 DIAGNOSIS — Z79899 Other long term (current) drug therapy: Secondary | ICD-10-CM | POA: Insufficient documentation

## 2020-06-12 DIAGNOSIS — S0990XA Unspecified injury of head, initial encounter: Secondary | ICD-10-CM | POA: Diagnosis present

## 2020-06-12 DIAGNOSIS — F17210 Nicotine dependence, cigarettes, uncomplicated: Secondary | ICD-10-CM | POA: Diagnosis not present

## 2020-06-12 DIAGNOSIS — G3183 Dementia with Lewy bodies: Secondary | ICD-10-CM | POA: Insufficient documentation

## 2020-06-12 DIAGNOSIS — Z7982 Long term (current) use of aspirin: Secondary | ICD-10-CM | POA: Insufficient documentation

## 2020-06-12 DIAGNOSIS — S0003XA Contusion of scalp, initial encounter: Secondary | ICD-10-CM | POA: Diagnosis not present
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
## 2020-06-12 NOTE — ED Notes (Signed)
[facility] has arrived to pick up pt. Gave [facility] pt's DNR paper, discharge forms, and paperwork from [facility]

## 2020-06-12 NOTE — ED Notes (Signed)
This RN called pt's POA and left voicemail to let her know pt is returning to nursing facility. Will continue to try to reach her. [facility] will take pt back to facility

## 2020-06-12 NOTE — ED Notes (Signed)
Pt transported to Xray and CT. 

## 2020-06-12 NOTE — ED Notes (Signed)
This RN called report to [place] at [facility].
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
## 2020-06-12 NOTE — ED Notes (Signed)
This RN was able to get in touch with Maria Wu, Pt's POA and let her know pt's condition and pt is returning to [facility].

## 2020-06-12 NOTE — ED Notes (Signed)
Pt walked with assistance. Does not appear to have any difficulty.

## 2020-07-15 ENCOUNTER — Emergency Department (HOSPITAL_COMMUNITY)
Admission: EM | Admit: 2020-07-15 | Discharge: 2020-07-15 | Disposition: A | Discharge status: Home / Self Care | Payer: Medicare Other | Attending: Emergency Medicine | Admitting: Emergency Medicine

## 2020-07-15 ENCOUNTER — Encounter (HOSPITAL_COMMUNITY): Payer: Self-pay | Admitting: Emergency Medicine

## 2020-07-15 ENCOUNTER — Emergency Department (HOSPITAL_COMMUNITY): Payer: Medicare Other

## 2020-07-15 DIAGNOSIS — G20 Parkinson's disease: Secondary | ICD-10-CM | POA: Diagnosis not present

## 2020-07-15 DIAGNOSIS — S0083XA Contusion of other part of head, initial encounter: Secondary | ICD-10-CM

## 2020-07-15 DIAGNOSIS — R001 Bradycardia, unspecified: Secondary | ICD-10-CM | POA: Diagnosis not present

## 2020-07-15 DIAGNOSIS — S0993XA Unspecified injury of face, initial encounter: Secondary | ICD-10-CM | POA: Diagnosis present

## 2020-07-15 DIAGNOSIS — F17210 Nicotine dependence, cigarettes, uncomplicated: Secondary | ICD-10-CM | POA: Diagnosis not present

## 2020-07-15 DIAGNOSIS — Z79899 Other long term (current) drug therapy: Secondary | ICD-10-CM | POA: Diagnosis not present

## 2020-07-15 DIAGNOSIS — Z7982 Long term (current) use of aspirin: Secondary | ICD-10-CM | POA: Diagnosis not present

## 2020-07-15 DIAGNOSIS — W19XXXA Unspecified fall, initial encounter: Secondary | ICD-10-CM

## 2020-07-15 LAB — CBC
HCT: 40 % (ref 36.0–46.0)
Hemoglobin: 12.8 g/dL (ref 12.0–15.0)
MCH: 28.3 pg (ref 26.0–34.0)
MCHC: 32 g/dL (ref 30.0–36.0)
MCV: 88.3 fL (ref 80.0–100.0)
Platelets: 239 10*3/uL (ref 150–400)
RBC: 4.53 MIL/uL (ref 3.87–5.11)
RDW: 17.3 % — ABNORMAL HIGH (ref 11.5–15.5)
WBC: 8.4 10*3/uL (ref 4.0–10.5)
nRBC: 0 % (ref 0.0–0.2)

## 2020-07-15 LAB — BASIC METABOLIC PANEL
Anion gap: 7 (ref 5–15)
BUN: 21 mg/dL (ref 8–23)
CO2: 27 mmol/L (ref 22–32)
Calcium: 9.1 mg/dL (ref 8.9–10.3)
Chloride: 106 mmol/L (ref 98–111)
Creatinine, Ser: 0.66 mg/dL (ref 0.44–1.00)
GFR, Estimated: >60 mL/min (ref >60)
Glucose, Bld: 96 mg/dL (ref 70–99)
Potassium: 3.6 mmol/L (ref 3.5–5.1)
Sodium: 140 mmol/L (ref 135–145)
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
## 2020-07-15 NOTE — Discharge Instructions (Signed)
CAT scans were normal today.  Blood work also looked normal.  No signs of internal bleeding.  Tylenol to be used as needed for pain.  Ice will help some with the swelling

## 2020-07-15 NOTE — ED Notes (Signed)
Pt consistenly took EKG leads out while test was trying to be perform. RN [facility] notified. Test not completed

## 2020-07-15 NOTE — ED Notes (Signed)
[facility] called for transport.  

## 2020-10-15 ENCOUNTER — Other Ambulatory Visit: Payer: Self-pay

## 2020-10-15 ENCOUNTER — Non-Acute Institutional Stay: Payer: Medicare Other | Admitting: Nurse Practitioner

## 2020-10-15 DIAGNOSIS — F02818 Dementia in other diseases classified elsewhere, unspecified severity, with other behavioral disturbance: Secondary | ICD-10-CM

## 2020-10-15 DIAGNOSIS — R634 Abnormal weight loss: Secondary | ICD-10-CM

## 2020-10-15 DIAGNOSIS — F0281 Dementia in other diseases classified elsewhere with behavioral disturbance: Secondary | ICD-10-CM

## 2020-10-15 DIAGNOSIS — Z515 Encounter for palliative care: Secondary | ICD-10-CM

## 2020-10-15 NOTE — Progress Notes (Signed)
[facility] Consult Note Telephone: [phone]  Fax: [phone]    Date of encounter: 10/15/20 PATIENT NAME: Maria Wu [address] [place] [address]   [phone] (home)  DOB: 08-13-1950 MRN: [id number]  PRIMARY CARE PROVIDER:    Nelia Shi, MD,  [address] [place] [facility] Internal [facility] [place] [address] [phone]  REFERRING PROVIDER:   Monico Blitz, NP  RESPONSIBLE PARTY:    Contact Information     Name Relation Home Work Mobile   Coulson,Anne Significant other [phone]  [phone]     I met face to face with patient in facility.  Palliative Care was asked to follow this patient by consultation request of  Powell-Tillman, Heath Gold* to address advance care planning and complex medical decision making. This is a follow up visit.                                  ASSESSMENT AND PLAN / RECOMMENDATIONS:   Advance Care Planning/Goals of Care:   CODE STATUS: DNR Goal of care: Goal of care is comfort. Directives: Signed DNR present on chart in the facility. Per facility staff, family yet to return MOST form mailed to them for signature. Comfort measures, determine use and limitation of abx, IV fluids for a defined trial period, no feeding tube. Patient has document on chart showing that she will be donating her body for medical science.  Symptom Management/Plan: Weight loss: BMI 18.3 down from 20.6.  Weight loss likely related to ongoing wandering behavior. Continue supportive care. May consider increasing nutritional supplement Ensure to three times a day. Provide finger food snacks for easy snacking while ambulating. Dementia: continue supportive care. Staff report  Aricept, Namenda, Meloxicam, Valtrex were discontinued.to reduce pill burden. Behavior controlled on as needed Ativan. Maintain safety, prevent falls per facility protocol.  Follow up Palliative Care  Visit: Palliative care will continue to follow for complex medical decision making, advance care planning, and clarification of goals. Return as needed.  PPS: 50% weak  HOSPICE ELIGIBILITY/DIAGNOSIS: TBD  CHIEF COMPLAIN: weight loss  History obtained from review of Epic EMR, patient's chart, and discussion with facility staff.    HISTORY OF PRESENT ILLNESS:  Maria Wu is a 70 y.o. year old female with medical problems significant for Lewy body dementia, parkinson's, CAD, PVD. IBS, depression.  Patient with ongoing weight loss, BMI 18.3 down from 20.6 eight months ago. Patient with frequent falls related to obsessive wandering within the facility with difficulty redirecting to stationary task. Staff report patient 's non-essential meds were discontinued by facility provider to reduce pill burden. Staff voiced no new concerns for patient today, no report of fever, chills, increased SOB, or uncontrolled pain. Unable to complete ROS with patient due to poor cognition related to advance dementia. Component Ref Range & Units 3 mo ago 5 mo ago 3 yr ago  Sodium 135 - 145 mmol/L 140  141  137   Potassium 3.5 - 5.1 mmol/L 3.6  2.8 Low   3.3 Low    Chloride 98 - 111 mmol/L 106  103  101 R   CO2 22 - 32 mmol/L _0 Glucose, Bld 70 - 99 mg/dL 96  128 High  CM  93 R   Comment: Glucose reference range applies only to samples taken after fasting for at least  8 hours.  BUN 8 - 23 mg/dL 21  25 High   17 R   Creatinine, Ser 0.44 - 1.00 mg/dL 0.66  1.05 High   0.68   Calcium 8.9 - 10.3 mg/dL 9.1  9.7  9.6   GFR, Estimated >60 mL/min >60  57 Low  CM     Component Ref Range & Units 3 mo ago 5 mo ago 3 yr ago  Sodium 135 - 145 mmol/L 140  141  137   Potassium 3.5 - 5.1 mmol/L 3.6  2.8 Low   3.3 Low    Chloride 98 - 111 mmol/L 106  103  101 R   CO2 22 - 32 mmol/L 27  25  23   Glucose, Bld 70 - 99 mg/dL 96  128 High  CM  93 R   Comment: Glucose reference range applies only to samples taken after  fasting for at least 8 hours.  BUN 8 - 23 mg/dL 21  25 High   17 R   Creatinine, Ser 0.44 - 1.00 mg/dL 0.66  1.05 High   0.68   Calcium 8.9 - 10.3 mg/dL 9.1  9.7  9.6   GFR, Estimated >60 mL/min >60  57 Low  CM    Comment: (NOTE)  Calculated using the CKD-EPI Creatinine Equation (2021)   Anion gap 5 - 15 7  13 CM  13    CT head from 07/15/2020 showed moderate cerebral atrophy and mild chronic small vessel ischemic disease  I reviewed available labs, medications, imaging, studies and related documents from the EMR.  Records reviewed and summarized above.   Physical Exam: Current and past weights: 106.8lbs down from 120lbs nine months ago, Ht 5f 4", BMI 18.3kg/m2 General: frail appearing, thin, NAD EYES: anicteric sclera, no discharge  ENMT: intact hearing, oral mucous membranes moist CV: S1S2 normal, no LE edema Pulmonary: LCTA, no increased work of breathing, no cough, room air Abdomen: no ascites GU: deferred MSK: sarcopenia, moves all extremities, ambulatory, unsteady gait Skin: warm and dry, no rashes or wounds on visible skin Neuro: generalized weakness,  severe cognitive impairment Psych: non-anxious affect, alert Hem/lymph/immuno: no widespread bruising  Past Medical History:  Diagnosis Date   Lewy body dementia (HCC)    Parkinson's disease (HCC)     Thank you for the opportunity to participate in the care of Ms. Polivka.  The palliative care team will continue to follow. Please call our office at [phone] if we can be of additional assistance.    , DNP, AGPCNP-BC  COVID-19 PATIENT SCREENING TOOL Asked and negative response unless otherwise noted:   Have you had symptoms of covid, tested positive or been in contact with someone with symptoms/positive test in the past 5-10 days?   

## 2020-10-29 ENCOUNTER — Emergency Department (HOSPITAL_COMMUNITY)
Admission: EM | Admit: 2020-10-29 | Discharge: 2020-10-30 | Disposition: A | Discharge status: Skilled Nursing Facility | Payer: Medicare Other | Attending: Emergency Medicine | Admitting: Emergency Medicine

## 2020-10-29 ENCOUNTER — Emergency Department (HOSPITAL_COMMUNITY): Payer: Medicare Other

## 2020-10-29 ENCOUNTER — Other Ambulatory Visit: Payer: Self-pay

## 2020-10-29 DIAGNOSIS — Z7982 Long term (current) use of aspirin: Secondary | ICD-10-CM | POA: Diagnosis not present

## 2020-10-29 DIAGNOSIS — F17210 Nicotine dependence, cigarettes, uncomplicated: Secondary | ICD-10-CM | POA: Insufficient documentation

## 2020-10-29 DIAGNOSIS — G20 Parkinson's disease: Secondary | ICD-10-CM | POA: Diagnosis not present

## 2020-10-29 DIAGNOSIS — Y92129 Unspecified place in nursing home as the place of occurrence of the external cause: Secondary | ICD-10-CM | POA: Diagnosis not present

## 2020-10-29 DIAGNOSIS — F0390 Unspecified dementia without behavioral disturbance: Secondary | ICD-10-CM | POA: Insufficient documentation

## 2020-10-29 DIAGNOSIS — S0990XA Unspecified injury of head, initial encounter: Secondary | ICD-10-CM

## 2020-10-29 DIAGNOSIS — W19XXXA Unspecified fall, initial encounter: Secondary | ICD-10-CM

## 2020-10-29 DIAGNOSIS — S0001XA Abrasion of scalp, initial encounter: Secondary | ICD-10-CM | POA: Diagnosis not present

## 2020-10-29 DIAGNOSIS — Z79899 Other long term (current) drug therapy: Secondary | ICD-10-CM | POA: Diagnosis not present

## 2020-10-29 NOTE — ED Provider Notes (Signed)
[facility] EMERGENCY DEPARTMENT Provider Note   CSN: [id number] Arrival date & time: 10/29/20  1830     History Chief Complaint  Patient presents with   Marletta Lor    Maria Wu is a 70 y.o. female.  HPI Patient with witnessed fall, lives at a nursing care facility.  She is unable to give any history.  Level 5, caveat-dementia  Past Medical History:  Diagnosis Date   Lewy body dementia (HCC)    Parkinson's disease (HCC)     There are no problems to display for this patient.   Past Surgical History:  Procedure Laterality Date   CARPAL TUNNEL RELEASE     NO PAST SURGERIES       OB History   No obstetric history on file.     No family history on file.  Social History   Tobacco Use   Smoking status: Every Day    Packs/day: 0.50    Pack years: 0.00    Types: Cigarettes   Smokeless tobacco: Never  Vaping Use   Vaping Use: Never used  Substance Use Topics   Alcohol use: Not Currently   Drug use: Not Currently    Home Medications Prior to Admission medications   Medication Sig Start Date End Date Taking? Authorizing Provider  acetaminophen (TYLENOL) 325 MG tablet Take 650 mg by mouth every 6 (six) hours as needed for mild pain.    [provider]  aspirin EC 81 MG tablet Take 81 mg by mouth daily.    [provider]  busPIRone (BUSPAR) 10 MG tablet Take 10 mg by mouth daily.    [provider]  cetirizine (ZYRTEC) 10 MG tablet Take 10 mg by mouth at bedtime.    [provider]  cilostazol (PLETAL) 100 MG tablet Take 100 mg by mouth 2 (two) times daily.    [provider]  citalopram (CELEXA) 40 MG tablet Take 40 mg by mouth daily.    [provider]  Cranberry 500 MG CAPS Take 1 capsule by mouth daily.    [provider]  donepezil (ARICEPT) 10 MG tablet Take 10 mg by mouth daily.    [provider]  enalapril (VASOTEC) 2.5 MG tablet Take 2.5 mg by mouth daily.     [provider]  ezetimibe (ZETIA) 10 MG tablet Take 10 mg by mouth daily.    [provider]  loperamide (IMODIUM) 2 MG capsule Take 4 mg by mouth daily as needed for diarrhea or loose stools (Max 4 capsules in 24 hours).    [provider]  LORazepam (ATIVAN) 0.5 MG tablet Take 0.25 mg by mouth every 8 (eight) hours as needed for anxiety.    [provider]  Melatonin 3 MG TABS Take 1 tablet by mouth at bedtime.    [provider]  meloxicam (MOBIC) 7.5 MG tablet Take 7.5 mg by mouth daily.    [provider]  memantine (NAMENDA) 10 MG tablet Take 10 mg by mouth daily.    [provider]  metoprolol tartrate (LOPRESSOR) 25 MG tablet Take 12.5 mg by mouth 2 (two) times daily.    [provider]  nitrofurantoin, macrocrystal-monohydrate, (MACROBID) 100 MG capsule Take 1 capsule (100 mg total) by mouth 2 (two) times daily. Patient not taking: Reported on 05/14/2020 02/04/19   Verlee Monte, NP  nitroGLYCERIN (NITROSTAT) 0.4 MG SL tablet Place 0.4 mg under the tongue every 5 (five) minutes as needed for  chest pain. 05/03/07   [provider]  phenazopyridine (PYRIDIUM) 100 MG tablet Take 2 tablets (200 mg total) by mouth 3 (three) times daily. Patient not taking: Reported on 05/14/2020 09/23/18   Verlee MonteGray, Bryan E, NP  potassium chloride (KLOR-CON) 10 MEQ tablet Take 1 tablet (10 mEq total) by mouth 2 (two) times daily. 05/14/20   Linwood DibblesKnapp, Jon, MD  Probiotic Product (PROBIOTIC FORMULA) CAPS Take 1 capsule by mouth daily.    [provider]  Sunscreens (COPPERTONE SPORT SPF 30 EX) Apply 1 application topically See admin instructions. Apply to exposed skin every 2 hours as needed while in sun    [provider]  valACYclovir (VALTREX) 500 MG tablet Take 500 mg by mouth daily.    [provider]    Allergies    Sulfa antibiotics, Clopidogrel, Coumadin [warfarin sodium], Statins, and Varenicline  Review of  Systems   Review of Systems  Unable to perform ROS: Dementia   Physical Exam Updated Vital Signs BP 136/74 (BP Location: Right Arm)   Pulse 60   Temp 97.9 F (36.6 C) (Oral)   Resp 18   SpO2 98%   Physical Exam Vitals and nursing note reviewed.  Constitutional:      General: She is not in acute distress.    Appearance: She is well-developed. She is not ill-appearing, toxic-appearing or diaphoretic.  HENT:     Head: Normocephalic.     Comments: Tender parietal with mild swelling.  No laceration or abrasion    Right Ear: External ear normal.     Left Ear: External ear normal.  Eyes:     Conjunctiva/sclera: Conjunctivae normal.     Pupils: Pupils are equal, round, and reactive to light.  Neck:     Trachea: Phonation normal.  Cardiovascular:     Rate and Rhythm: Normal rate.  Pulmonary:     Effort: Pulmonary effort is normal.  Abdominal:     General: There is no distension.     Tenderness: There is no abdominal tenderness.  Musculoskeletal:        General: Normal range of motion.     Cervical back: Normal range of motion and neck supple.  Skin:    General: Skin is warm and dry.  Neurological:     Mental Status: She is alert and oriented to person, place, and time.     Cranial Nerves: No cranial nerve deficit.     Sensory: No sensory deficit.     Motor: No abnormal muscle tone.     Coordination: Coordination normal.  Psychiatric:        Mood and Affect: Mood normal.        Behavior: Behavior normal.        Thought Content: Thought content normal.        Judgment: Judgment normal.    ED Results / Procedures / Treatments   Labs (all labs ordered are listed, but only abnormal results are displayed) Labs Reviewed - No data to display  EKG None  Radiology CT Head Wo Contrast  Result Date: 10/29/2020 CLINICAL DATA:  Fall. Polytrauma, critical, head/C-spine injury suspected EXAM: CT HEAD WITHOUT CONTRAST TECHNIQUE: Contiguous axial images were obtained from the base  of the skull through the vertex without intravenous contrast. COMPARISON:  Head CT 07/15/2020 FINDINGS: Brain: Stable atrophy and chronic small vessel ischemia. No intracranial hemorrhage, mass effect, or midline shift. No hydrocephalus. The basilar cisterns are patent. No evidence of territorial infarct or acute ischemia. No extra-axial or  intracranial fluid collection. Vascular: Atherosclerosis of skullbase vasculature without hyperdense vessel or abnormal calcification. Skull: No fracture or focal lesion. Sinuses/Orbits: Paranasal sinuses and mastoid air cells are clear. The visualized orbits are unremarkable. Other: Posterior parietal scalp hematoma and laceration. IMPRESSION: 1. Posterior parietal scalp hematoma and laceration. No acute intracranial abnormality. No skull fracture. 2. Stable atrophy and chronic small vessel ischemia. Electronically Signed   By: Narda Rutherford M.D.   On: 10/29/2020 20:23   CT Cervical Spine Wo Contrast  Result Date: 10/29/2020 CLINICAL DATA:  Fall.  Cervical spine injury suspected. EXAM: CT CERVICAL SPINE WITHOUT CONTRAST TECHNIQUE: Multidetector CT imaging of the cervical spine was performed without intravenous contrast. Multiplanar CT image reconstructions were also generated. COMPARISON:  Cervical spine CT 06/12/2020 FINDINGS: Alignment: Again seen straightening of normal lordosis. Trace retrolisthesis of C3 on C4 is unchanged. There is no traumatic subluxation. Skull base and vertebrae: No acute fracture. Vertebral body heights are maintained. The dens and skull base are intact. Soft tissues and spinal canal: No prevertebral fluid or swelling. No visible canal hematoma. Disc levels: Stable appearance of degenerative disc disease and facet hypertrophy from prior. Upper chest: Emphysema.  No acute findings. Other: Carotid calcifications. IMPRESSION: 1. No acute fracture or subluxation of the cervical spine. 2. Stable appearance of multilevel degenerative disc disease and  facet hypertrophy. Electronically Signed   By: Narda Rutherford M.D.   On: 10/29/2020 20:26    Procedures Procedures   Medications Ordered in ED Medications - No data to display  ED Course  I have reviewed the triage vital signs and the nursing notes.  Pertinent labs & imaging results that were available during my care of the patient were reviewed by me and considered in my medical decision making (see chart for details).    MDM Rules/Calculators/A&P                           Patient Vitals for the past 24 hrs:  BP Temp Temp src Pulse Resp SpO2  10/29/20 2035 136/74 -- -- 60 18 98 %  10/29/20 1930 (!) 146/85 -- -- (!) 56 17 98 %  10/29/20 1915 129/68 -- -- (!) 56 18 100 %  10/29/20 1900 124/77 -- -- (!) 57 (!) 26 100 %  10/29/20 1845 127/75 -- -- (!) 54 18 99 %  10/29/20 1840 (!) 116/56 97.9 F (36.6 C) Oral (!) 56 18 100 %    9:05 PM Reevaluation with update and discussion. After initial assessment and treatment, an updated evaluation reveals no change in clinical status, findings were discussed with the patient's significant other.  At this time her cervical collar was removed, she has a very minor abrasion with limited bleeding of the small area of her parietal region. Mancel Bale   Medical Decision Making:  This patient is presenting for evaluation of witnessed fall, which does require a range of treatment options, and is a complaint that involves a moderate risk of morbidity and mortality. The differential diagnoses include contusion, intracranial injury, cervical spine injury. I decided to review old records, and in summary this is an elderly patient with Lewy body dementia and visual trouble.  I obtained additional historical information from her significant other and.  She reports that the patient has had significant falls, for many years, and typically falls and strikes her forehead.  And reports that the patient has very limited visual ability, and that she is very  active, despite  having some balance trouble related to the vision.  Radiologic Tests Ordered, included CT images cervical spine and head.  I independently Visualized: CT images, which show no acute abnormality   Critical Interventions-clinical evaluation, CT imaging, reassessment  After These Interventions, the Patient was reevaluated and was found to require evaluation for fall, likely associated with debilitated state and Lewy body dementia.  Patient with recurrent falls, fall today likely related to balance and visual disorder.  No serious injuries.  She is stable for discharge.  CRITICAL CARE-no Performed by: Mancel Bale  Nursing Notes Reviewed/ Care Coordinated Applicable Imaging Reviewed Interpretation of Laboratory Data incorporated into ED treatment  The patient appears reasonably screened and/or stabilized for discharge and I doubt any other medical condition or other [facility] requiring further screening, evaluation, or treatment in the ED at this time prior to discharge.  Plan: Home Medications-continue; Home Treatments-gradual advance activity; return here if the recommended treatment, does not improve the symptoms; Recommended follow up-[facility], as needed     Final Clinical Impression(s) / ED Diagnoses Final diagnoses:  Fall, initial encounter  Injury of head, initial encounter  Abrasion of scalp, initial encounter    Rx / DC Orders ED Discharge Orders     None        Mancel Bale, MD 10/29/20 2105

## 2020-10-29 NOTE — ED Notes (Signed)
Patient transported to CT 

## 2020-10-29 NOTE — Discharge Instructions (Addendum)
No serious injuries were found after the fall today.  She has a small abrasion of the parietal scalp.  This can be treated by cleansing daily to help prevent infection.  For pain she can take Tylenol.  Not sure when she walks to prevent falls.  Return here or see her doctor if needed for problems.

## 2020-10-29 NOTE — ED Triage Notes (Signed)
Fall from standing position, impact on back of head. witnessed by staff; -LOC. Per EMS patient was combative w/ + hx dementia.

## 2020-10-29 NOTE — ED Notes (Signed)
Called [facility] to transport patient to [facility]

## 2020-11-05 ENCOUNTER — Encounter (HOSPITAL_COMMUNITY): Payer: Self-pay | Admitting: Emergency Medicine

## 2020-11-05 ENCOUNTER — Emergency Department (HOSPITAL_COMMUNITY): Payer: Medicare Other

## 2020-11-05 ENCOUNTER — Emergency Department (HOSPITAL_COMMUNITY)
Admission: EM | Admit: 2020-11-05 | Discharge: 2020-11-06 | Disposition: A | Discharge status: Home / Self Care | Payer: Medicare Other | Attending: Emergency Medicine | Admitting: Emergency Medicine

## 2020-11-05 ENCOUNTER — Other Ambulatory Visit: Payer: Self-pay

## 2020-11-05 DIAGNOSIS — W01198A Fall on same level from slipping, tripping and stumbling with subsequent striking against other object, initial encounter: Secondary | ICD-10-CM | POA: Insufficient documentation

## 2020-11-05 DIAGNOSIS — Z7982 Long term (current) use of aspirin: Secondary | ICD-10-CM | POA: Insufficient documentation

## 2020-11-05 DIAGNOSIS — F0390 Unspecified dementia without behavioral disturbance: Secondary | ICD-10-CM | POA: Insufficient documentation

## 2020-11-05 DIAGNOSIS — S0101XA Laceration without foreign body of scalp, initial encounter: Secondary | ICD-10-CM | POA: Diagnosis not present

## 2020-11-05 DIAGNOSIS — F17210 Nicotine dependence, cigarettes, uncomplicated: Secondary | ICD-10-CM | POA: Diagnosis not present

## 2020-11-05 DIAGNOSIS — Y92129 Unspecified place in nursing home as the place of occurrence of the external cause: Secondary | ICD-10-CM | POA: Insufficient documentation

## 2020-11-05 DIAGNOSIS — G20 Parkinson's disease: Secondary | ICD-10-CM | POA: Diagnosis not present

## 2020-11-05 DIAGNOSIS — S0990XA Unspecified injury of head, initial encounter: Secondary | ICD-10-CM | POA: Diagnosis present

## 2020-11-05 NOTE — ED Provider Notes (Signed)
[facility]-EMERGENCY DEPT Provider Note   CSN: [id number] Arrival date & time: 11/05/20  1844     History Chief Complaint  Patient presents with   Maria Wu    Maria Wu is a 70 y.o. female presenting from nursing facility with a witnessed fall and injury to the back of the head.  The patient has significant dementia cannot verbalize provide history.  Per EMS report, the patient comes from [facility] where she had a witnessed fall from standing striking the back of her head on the ground.  She was small laceration bleeding was controlled.  No other injuries noted.  HPI     Past Medical History:  Diagnosis Date   Lewy body dementia (HCC)    Parkinson's disease (HCC)     There are no problems to display for this patient.   Past Surgical History:  Procedure Laterality Date   CARPAL TUNNEL RELEASE     NO PAST SURGERIES       OB History   No obstetric history on file.     No family history on file.  Social History   Tobacco Use   Smoking status: Every Day    Packs/day: 0.50    Pack years: 0.00    Types: Cigarettes   Smokeless tobacco: Never  Vaping Use   Vaping Use: Never used  Substance Use Topics   Alcohol use: Not Currently   Drug use: Not Currently    Home Medications Prior to Admission medications   Medication Sig Start Date End Date Taking? Authorizing Provider  acetaminophen (TYLENOL) 325 MG tablet Take 650 mg by mouth every 6 (six) hours as needed for mild pain.    [provider]  aspirin EC 81 MG tablet Take 81 mg by mouth daily.    [provider]  busPIRone (BUSPAR) 10 MG tablet Take 10 mg by mouth daily.    [provider]  cetirizine (ZYRTEC) 10 MG tablet Take 10 mg by mouth at bedtime.    [provider]  cilostazol (PLETAL) 100 MG tablet Take 100 mg by mouth 2 (two) times daily.    [provider]  citalopram (CELEXA) 40 MG tablet Take 40 mg by mouth daily.    [provider]  Cranberry 500 MG CAPS Take 1 capsule by mouth daily.    [provider]  donepezil (ARICEPT) 10 MG tablet Take 10 mg by mouth daily.    [provider]  enalapril (VASOTEC) 2.5 MG tablet Take 2.5 mg by mouth daily.    [provider]  ezetimibe (ZETIA) 10 MG tablet Take 10 mg by mouth daily.    [provider]  loperamide (IMODIUM) 2 MG capsule Take 4 mg by mouth daily as needed for diarrhea or loose stools (Max 4 capsules in 24 hours).    [provider]  LORazepam (ATIVAN) 0.5 MG tablet Take 0.25 mg by mouth every 8 (eight) hours as needed for anxiety.    [provider]  Melatonin 3 MG TABS Take 1 tablet by mouth at bedtime.    [provider]  meloxicam (MOBIC) 7.5 MG tablet Take 7.5 mg by mouth daily.    [provider]  memantine (NAMENDA) 10 MG tablet Take 10 mg by mouth daily.    [provider]  metoprolol tartrate (LOPRESSOR) 25 MG tablet Take 12.5 mg by mouth 2 (two) times daily.    [provider]  nitrofurantoin, macrocrystal-monohydrate, (MACROBID) 100 MG  capsule Take 1 capsule (100 mg total) by mouth 2 (two) times daily. Patient not taking: Reported on 05/14/2020 02/04/19   Verlee Monte, NP  nitroGLYCERIN (NITROSTAT) 0.4 MG SL tablet Place 0.4 mg under the tongue every 5 (five) minutes as needed for chest pain. 05/03/07   [provider]  phenazopyridine (PYRIDIUM) 100 MG tablet Take 2 tablets (200 mg total) by mouth 3 (three) times daily. Patient not taking: Reported on 05/14/2020 09/23/18   Verlee Monte, NP  potassium chloride (KLOR-CON) 10 MEQ tablet Take 1 tablet (10 mEq total) by mouth 2 (two) times daily. 05/14/20   Linwood Dibbles, MD  Probiotic Product (PROBIOTIC FORMULA) CAPS Take 1 capsule by mouth daily.    [provider]  Sunscreens (COPPERTONE SPORT SPF 30 EX) Apply 1 application topically See admin instructions. Apply to exposed skin every 2 hours as needed  while in sun    [provider]  valACYclovir (VALTREX) 500 MG tablet Take 500 mg by mouth daily.    [provider]    Allergies    Sulfa antibiotics, Clopidogrel, Coumadin [warfarin sodium], Statins, and Varenicline  Review of Systems   Review of Systems  Unable to perform ROS: Dementia (level 5 caveat)   Physical Exam Updated Vital Signs BP 118/87   Pulse 63   Resp 18   Ht 5\' 4"  (1.626 m)   Wt 59 kg   SpO2 100%   BMI 22.33 kg/m   Physical Exam Constitutional:      General: She is not in acute distress. HENT:     Head: Normocephalic.     Comments: Small 1 cm laceration to occiput, no active bleeding Cardiovascular:     Rate and Rhythm: Normal rate and regular rhythm.  Pulmonary:     Effort: Pulmonary effort is normal. No respiratory distress.  Abdominal:     General: There is no distension.     Tenderness: There is no abdominal tenderness.  Skin:    General: Skin is warm and dry.  Neurological:     Mental Status: She is alert.    ED Results / Procedures / Treatments   Labs (all labs ordered are listed, but only abnormal results are displayed) Labs Reviewed - No data to display  EKG None  Radiology CT Head Wo Contrast  Result Date: 11/05/2020 CLINICAL DATA:  70 year old female with head trauma. EXAM: CT HEAD WITHOUT CONTRAST TECHNIQUE: Contiguous axial images were obtained from the base of the skull through the vertex without intravenous contrast. COMPARISON:  Head CT dated 10/29/2020. FINDINGS: Brain: Mild age-related atrophy and chronic microvascular ischemic changes. There is no acute intracranial hemorrhage. No mass effect midline shift. No extra-axial fluid collection. Vascular: No hyperdense vessel or unexpected calcification. Skull: Normal. Negative for fracture or focal lesion. Sinuses/Orbits: No acute finding. Other: Mild right posterior parietal scalp laceration. IMPRESSION: 1. No acute intracranial pathology. 2. Mild age-related  atrophy and chronic microvascular ischemic changes. Electronically Signed   By: 12/30/2020 M.D.   On: 11/05/2020 21:08    Procedures Procedures   Medications Ordered in ED Medications - No data to display  ED Course  I have reviewed the triage vital signs and the nursing notes.  Pertinent labs & imaging results that were available during my care of the patient were reviewed by me and considered in my medical decision making (see chart for details).  Patient has significant dementia, she is here with a witnessed fall and an isolated injury to the  back of the head.  Her CT scan showed no acute intracranial bleed or injury.   Wound irrigated and cleaned - too small for stapling, and she won't tolerate this with her degree of dementia and combatitiveness.  I expect this will close and heal on its own, and has already stopped bleeding.  We dressed the wound in the ED.  No other acute traumatic injuries noted on my exam.   Clinical Course as of 11/06/20 0949  Mon Nov 05, 2020  2112 IMPRESSION: 1. No acute intracranial pathology. 2. Mild age-related atrophy and chronic microvascular ischemic changes. [MT]  2209 The wound in the back of her head is too small for stapling.  Patient to be discharged back to her facility. [MT]    Clinical Course User Index [MT] Dajour Pierpoint, Kermit Balo, MD    Final Clinical Impression(s) / ED Diagnoses Final diagnoses:  Laceration of scalp, initial encounter    Rx / DC Orders ED Discharge Orders     None        Terald Sleeper, MD 11/06/20 [phone]

## 2020-11-05 NOTE — ED Triage Notes (Signed)
Pt BIBA from [facility] with c/o a witnessed fall. Pt fell from a standing position, impact on the back of head. Pt does have a small laceration on the back of her head. Bleeding controlled and no use of blood thinners. Hx of dementia.   124 palpated HR-80 99% room air.

## 2020-11-05 NOTE — ED Notes (Signed)
Pt unable to sign MSE due to dementia. 

## 2020-11-05 NOTE — Discharge Instructions (Addendum)
The CT scan of Maria Wu's brain did not show any signs of a brain bleed.  She has a very small cut on the back of her head.  The bleeding was under control.  I did not feel that this cut required staples, or that she would tolerate staples placed or removed well.  We wrapped the wound as best we can - it may ooze for another 1-2 days, but then should be scabbed over.

## 2020-11-05 NOTE — ED Notes (Signed)
Dressing applied to head laceration.

## 2020-11-06 NOTE — ED Notes (Signed)
Pt's brief changed and pt repositioned in bed

## 2021-05-22 ENCOUNTER — Emergency Department (HOSPITAL_COMMUNITY)
Admission: EM | Admit: 2021-05-22 | Discharge: 2021-05-22 | Disposition: A | Discharge status: Home / Self Care | Payer: Medicare Other | Attending: Emergency Medicine | Admitting: Emergency Medicine

## 2021-05-22 ENCOUNTER — Emergency Department (HOSPITAL_COMMUNITY): Payer: Medicare Other

## 2021-05-22 DIAGNOSIS — Z23 Encounter for immunization: Secondary | ICD-10-CM | POA: Diagnosis not present

## 2021-05-22 DIAGNOSIS — S0101XA Laceration without foreign body of scalp, initial encounter: Secondary | ICD-10-CM | POA: Diagnosis not present

## 2021-05-22 DIAGNOSIS — Z7982 Long term (current) use of aspirin: Secondary | ICD-10-CM | POA: Diagnosis not present

## 2021-05-22 DIAGNOSIS — W500XXA Accidental hit or strike by another person, initial encounter: Secondary | ICD-10-CM | POA: Insufficient documentation

## 2021-05-22 DIAGNOSIS — F0390 Unspecified dementia without behavioral disturbance: Secondary | ICD-10-CM | POA: Diagnosis not present

## 2021-05-22 DIAGNOSIS — S0990XA Unspecified injury of head, initial encounter: Secondary | ICD-10-CM | POA: Diagnosis present

## 2021-05-22 LAB — I-STAT CREATININE, ED: Creatinine, Ser: 0.7 mg/dL (ref 0.44–1.00)

## 2021-05-22 MED ORDER — BACITRACIN ZINC 500 UNIT/GM EX OINT
TOPICAL_OINTMENT | Freq: Once | CUTANEOUS | Status: AC
  Filled 2021-05-22: qty 0.9

## 2021-05-22 MED ORDER — LIDOCAINE-EPINEPHRINE (PF) 2 %-1:200000 IJ SOLN
10.0000 mL | Freq: Once | INTRAMUSCULAR | Status: AC
  Administered 2021-05-22: 10 mL
  Filled 2021-05-22: qty 20

## 2021-05-22 MED ORDER — TETANUS-DIPHTH-ACELL PERTUSSIS 5-2.5-18.5 LF-MCG/0.5 IM SUSY
0.5000 mL | PREFILLED_SYRINGE | Freq: Once | INTRAMUSCULAR | Status: AC
  Administered 2021-05-22: 15:00:00 0.5 mL via INTRAMUSCULAR
  Filled 2021-05-22: qty 0.5

## 2021-05-22 NOTE — ED Notes (Signed)
[facility] contacted to arrange transport back to [facility]
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
## 2021-05-22 NOTE — ED Notes (Signed)
[facility] in ED to transport pt back to [facility]

## 2021-05-22 NOTE — Discharge Instructions (Signed)
Keep the wound area clean and dry using just plain soap and water once or twice a day.  Avoid using agents such as peroxide or antiseptic as this can cause further tissue necrosis.  Keep an eye out for infections such as drainage, redness, inflammation or fevers.  She can have Tylenol or Motrin as needed for pain.  Please return if she develops sudden change in mental status, signs of infection or becomes acutely very drowsy.

## 2021-05-22 NOTE — ED Notes (Signed)
[facility] contacted for update pt is 7th on the list to be transported

## 2021-05-22 NOTE — ED Notes (Signed)
Dc instructions reviewed with [employer]. No questions or concerns at this time.
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
## 2021-06-26 DEATH — deceased

## 2022-09-29 IMAGING — CT CT CERVICAL SPINE W/O CM
3 of 4 series · 11 of 33 positions shown, 13 images · non-contrast
Comparison: Cervical spine CT 06/12/2020

CLINICAL DATA: Fall.  Cervical spine injury suspected.

EXAM:
CT CERVICAL SPINE WITHOUT CONTRAST
TECHNIQUE: Multidetector CT imaging of the cervical spine was performed without
intravenous contrast. Multiplanar CT image reconstructions were also
generated.

[Series 4: c_spine 2.0 st · axial · 0.34mm/px · z∈[-393,-273]mm · 3 of 92 slices shown, 4 images]
[im 16/92  soft-tissue]
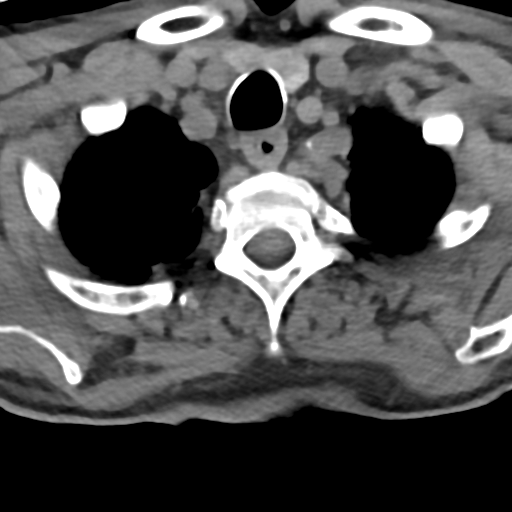
[im 16/92  bone]
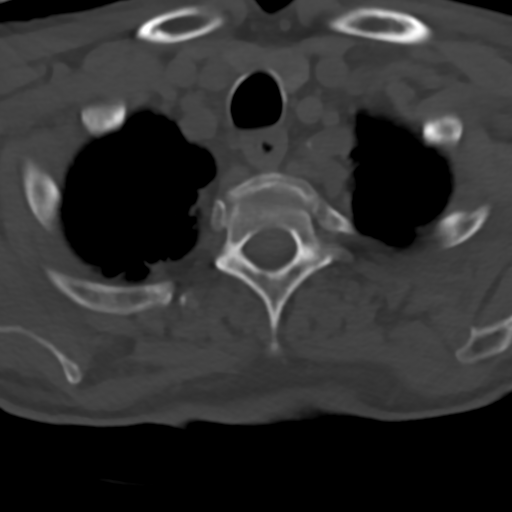
[im 46/92  bone]
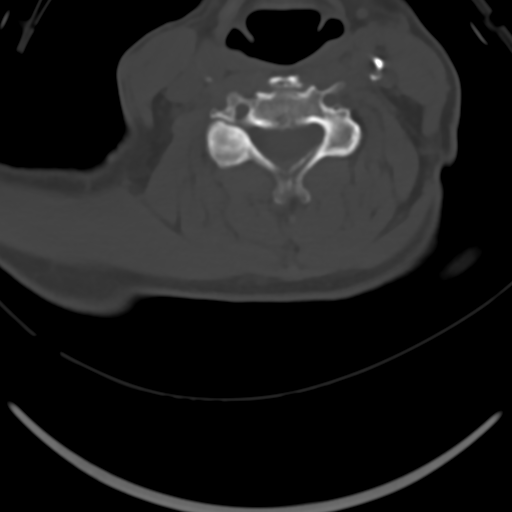
[im 76/92  bone]
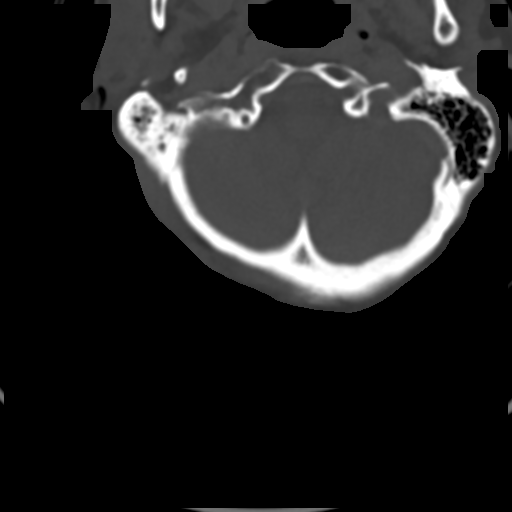

[Series 8: c_spine 2.0 sag bone · sagittal · 0.27mm/px · 5 of 61 slices shown, 6 images]
[im 21/61  bone]
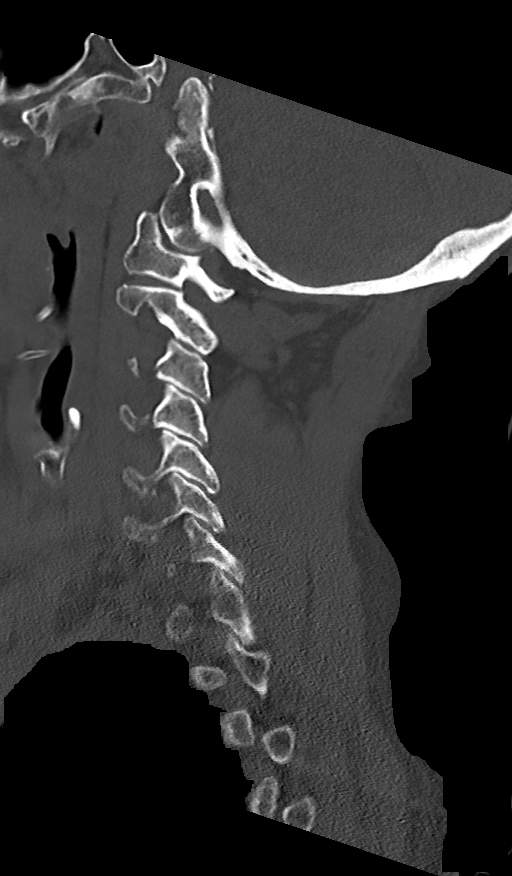
[im 26/61  bone]
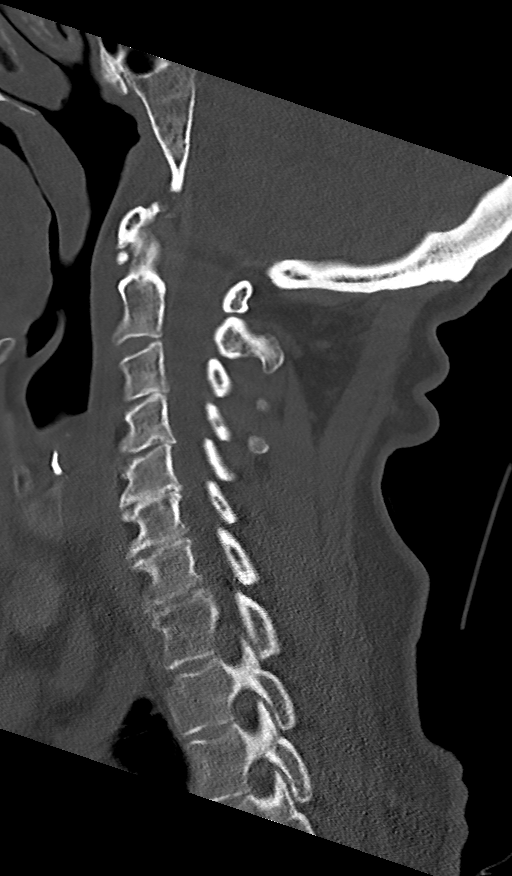
[im 31/61  soft-tissue]
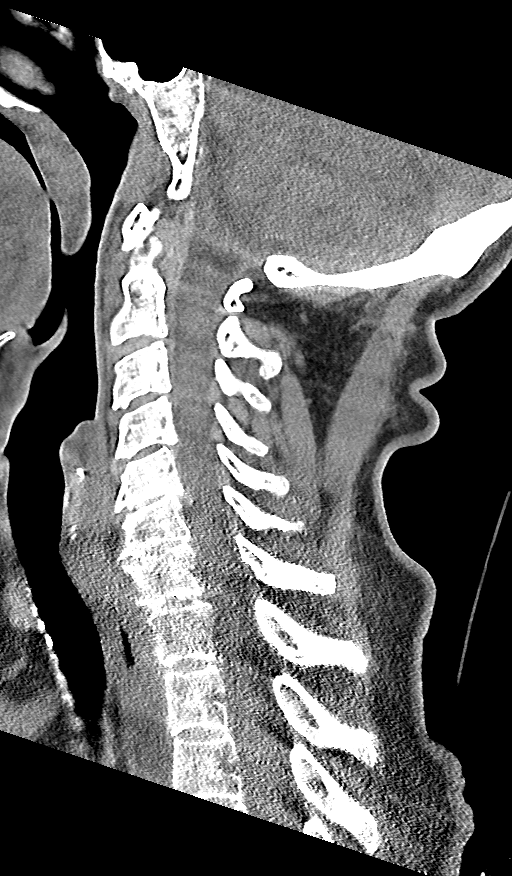
[im 31/61  bone]
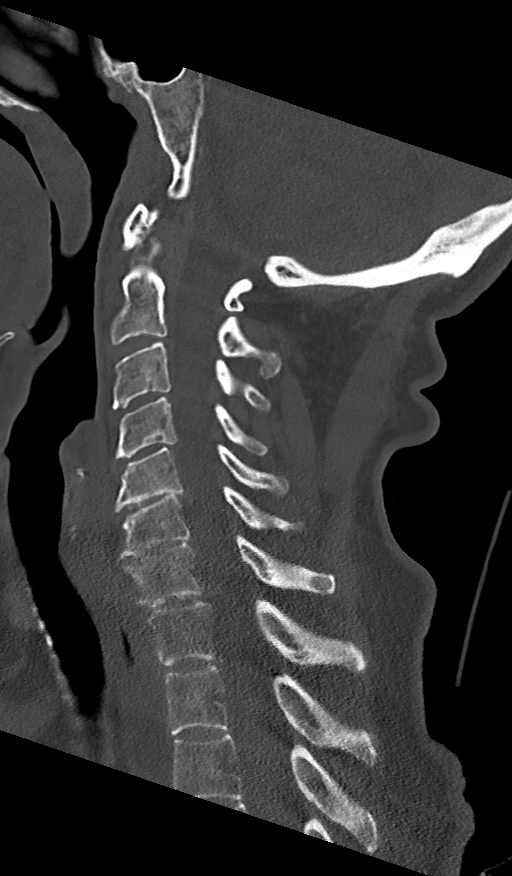
[im 36/61  bone]
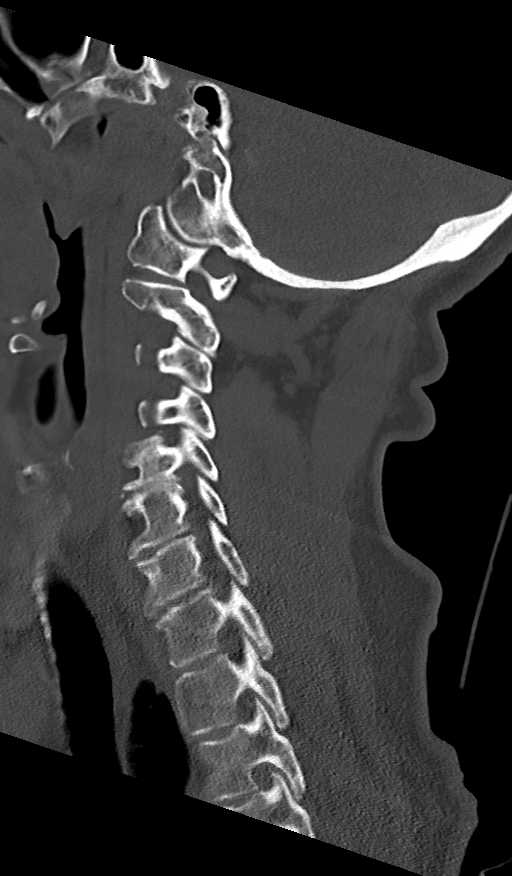
[im 41/61  bone]
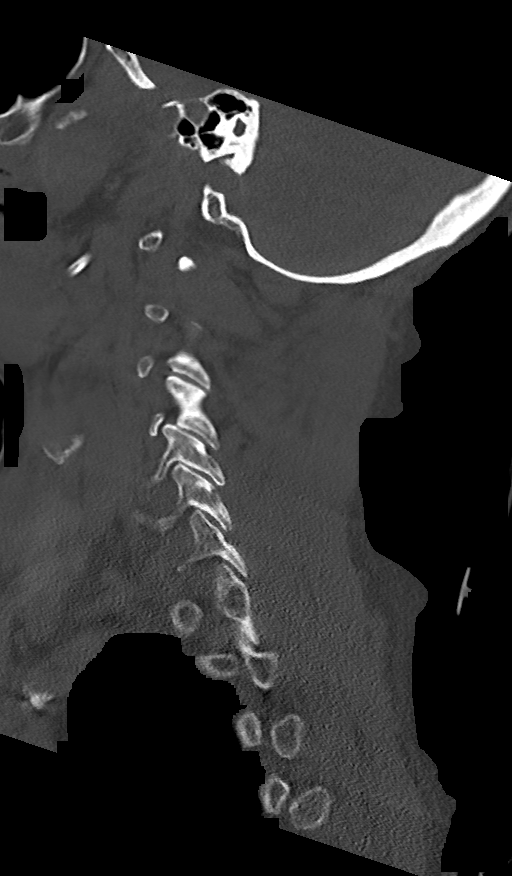

[Series 9: c_spine 2.0 cor bone · coronal · 0.27mm/px · 3 of 61 slices shown]
[im 13/61  bone]
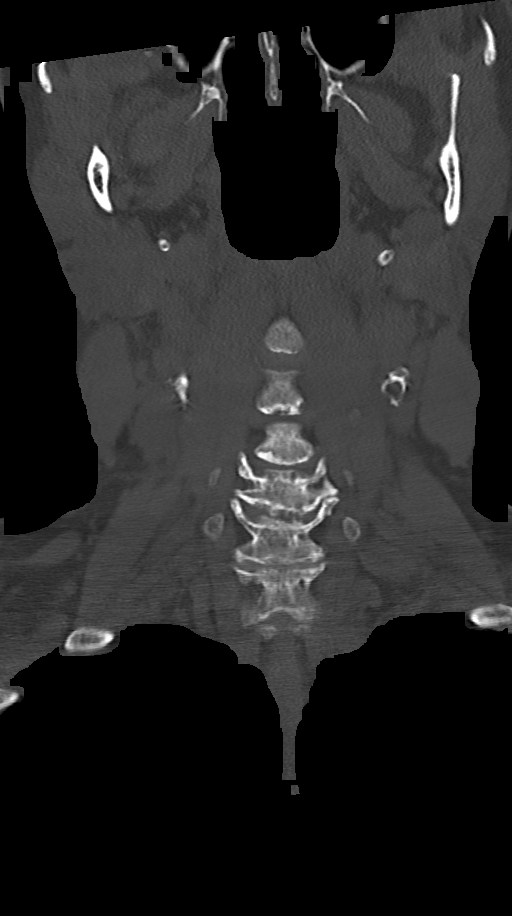
[im 25/61  bone]
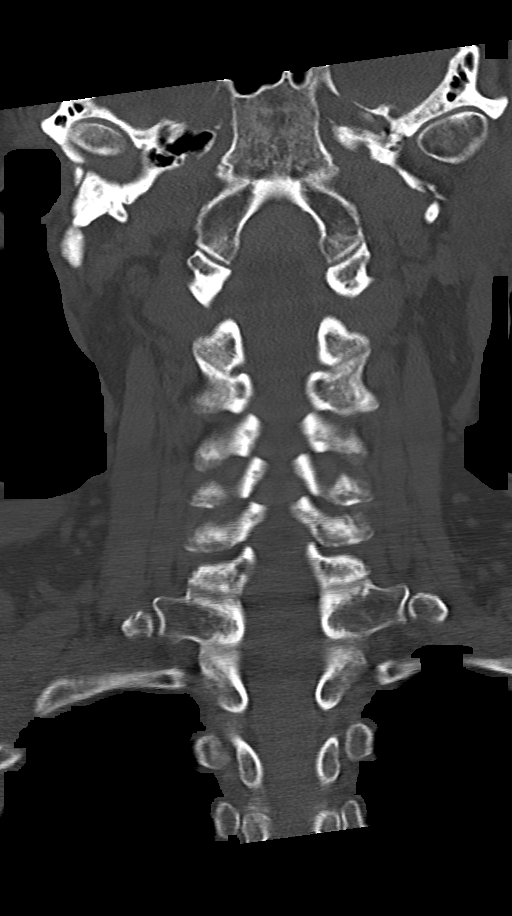
[im 36/61  bone]
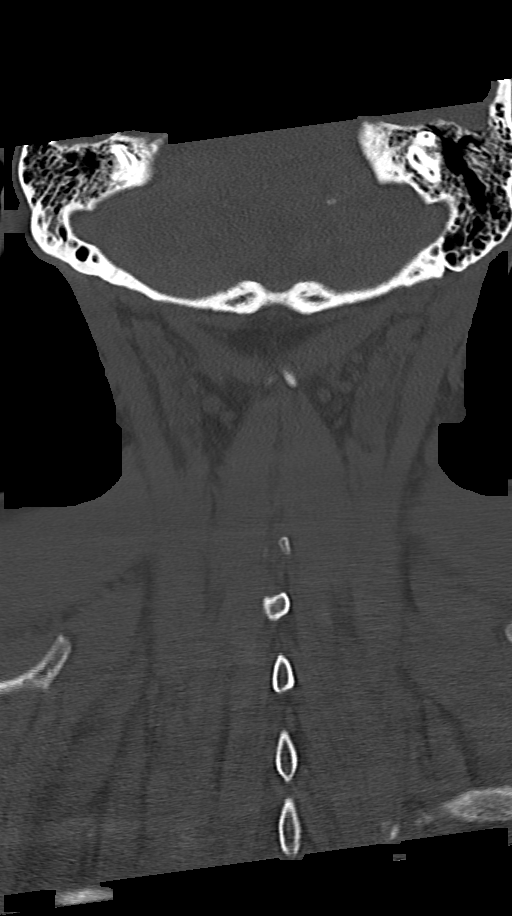

[11 of 33 positions shown; findings below may reference images not displayed]

FINDINGS: Alignment: Again seen straightening of normal lordosis. Trace
retrolisthesis of C3 on C4 is unchanged. There is no traumatic
subluxation.

Skull base and vertebrae: No acute fracture. Vertebral body heights
are maintained. The dens and skull base are intact.

Soft tissues and spinal canal: No prevertebral fluid or swelling. No
visible canal hematoma.

Disc levels: Stable appearance of degenerative disc disease and
facet hypertrophy from prior.

Upper chest: Emphysema.  No acute findings.

Other: Carotid calcifications.
IMPRESSION: 1. No acute fracture or subluxation of the cervical spine.
2. Stable appearance of multilevel degenerative disc disease and
facet hypertrophy.
